# Patient Record
Sex: Female | Born: 1950 | Race: Black or African American | Hispanic: No | Marital: Single
Health system: Southern US, Community
[De-identification: ages and names within clinical notes are randomized; demographics above are authoritative.]

---

## 2001-03-27 ENCOUNTER — Encounter: Payer: Self-pay | Admitting: Emergency Medicine

## 2001-03-27 ENCOUNTER — Emergency Department (HOSPITAL_COMMUNITY): Admission: AC | Admit: 2001-03-27 | Discharge: 2001-03-28 | Payer: Self-pay

## 2004-04-21 ENCOUNTER — Ambulatory Visit: Payer: Self-pay

## 2005-07-26 ENCOUNTER — Ambulatory Visit: Payer: Self-pay

## 2007-10-22 ENCOUNTER — Ambulatory Visit: Payer: Self-pay

## 2008-01-03 ENCOUNTER — Ambulatory Visit: Payer: Self-pay

## 2008-10-27 ENCOUNTER — Ambulatory Visit: Payer: Self-pay

## 2011-07-15 ENCOUNTER — Emergency Department: Payer: Self-pay | Admitting: Emergency Medicine

## 2011-07-15 LAB — COMPREHENSIVE METABOLIC PANEL
Albumin: 3.8 g/dL (ref 3.4–5.0)
Alkaline Phosphatase: 85 U/L (ref 50–136)
Anion Gap: 8 (ref 7–16)
BUN: 18 mg/dL (ref 7–18)
Bilirubin,Total: 0.3 mg/dL (ref 0.2–1.0)
Calcium, Total: 9.2 mg/dL (ref 8.5–10.1)
Chloride: 106 mmol/L (ref 98–107)
Co2: 29 mmol/L (ref 21–32)
Creatinine: 1.12 mg/dL (ref 0.60–1.30)
EGFR (African American): >60
EGFR (Non-African Amer.): 53 — ABNORMAL LOW
Glucose: 88 mg/dL (ref 65–99)
Osmolality: 286 (ref 275–301)
Potassium: 4.6 mmol/L (ref 3.5–5.1)
SGOT(AST): 46 U/L — ABNORMAL HIGH (ref 15–37)
SGPT (ALT): 32 U/L
Sodium: 143 mmol/L (ref 136–145)
Total Protein: 8.7 g/dL — ABNORMAL HIGH (ref 6.4–8.2)

## 2011-07-15 LAB — CBC
HCT: 40.4 % (ref 35.0–47.0)
HGB: 13.3 g/dL (ref 12.0–16.0)
MCH: 31.2 pg (ref 26.0–34.0)
MCHC: 33 g/dL (ref 32.0–36.0)
MCV: 95 fL (ref 80–100)
Platelet: 388 10*3/uL (ref 150–440)
RBC: 4.27 10*6/uL (ref 3.80–5.20)
RDW: 13.4 % (ref 11.5–14.5)
WBC: 6.2 10*3/uL (ref 3.6–11.0)

## 2012-05-27 ENCOUNTER — Emergency Department: Payer: Self-pay | Admitting: Emergency Medicine

## 2012-06-24 ENCOUNTER — Emergency Department: Payer: Self-pay | Admitting: Emergency Medicine

## 2012-07-24 ENCOUNTER — Emergency Department: Payer: Self-pay | Admitting: Emergency Medicine

## 2014-04-03 ENCOUNTER — Emergency Department: Payer: Self-pay | Admitting: Emergency Medicine

## 2014-06-10 ENCOUNTER — Emergency Department: Payer: Self-pay | Admitting: Emergency Medicine

## 2015-11-11 ENCOUNTER — Other Ambulatory Visit: Payer: Self-pay | Admitting: Obstetrics and Gynecology

## 2015-11-11 DIAGNOSIS — Z1231 Encounter for screening mammogram for malignant neoplasm of breast: Secondary | ICD-10-CM

## 2015-11-18 ENCOUNTER — Ambulatory Visit
Admission: RE | Admit: 2015-11-18 | Discharge: 2015-11-18 | Disposition: A | Discharge status: Home / Self Care | Payer: BLUE CROSS/BLUE SHIELD | Source: Ambulatory Visit | Attending: Obstetrics and Gynecology | Admitting: Obstetrics and Gynecology

## 2015-11-18 DIAGNOSIS — Z1231 Encounter for screening mammogram for malignant neoplasm of breast: Secondary | ICD-10-CM | POA: Diagnosis not present

## 2016-07-21 ENCOUNTER — Other Ambulatory Visit: Payer: Self-pay | Admitting: Family Medicine

## 2016-07-21 DIAGNOSIS — Z1231 Encounter for screening mammogram for malignant neoplasm of breast: Secondary | ICD-10-CM

## 2017-01-02 ENCOUNTER — Ambulatory Visit
Admission: RE | Admit: 2017-01-02 | Discharge: 2017-01-02 | Disposition: A | Discharge status: Home / Self Care | Payer: BLUE CROSS/BLUE SHIELD | Source: Ambulatory Visit | Attending: Family Medicine | Admitting: Family Medicine

## 2017-01-02 DIAGNOSIS — Z1231 Encounter for screening mammogram for malignant neoplasm of breast: Secondary | ICD-10-CM | POA: Diagnosis present

## 2017-02-15 ENCOUNTER — Other Ambulatory Visit: Payer: Self-pay | Admitting: Obstetrics and Gynecology

## 2017-02-15 DIAGNOSIS — M8580 Other specified disorders of bone density and structure, unspecified site: Secondary | ICD-10-CM

## 2017-04-16 ENCOUNTER — Ambulatory Visit
Admission: RE | Admit: 2017-04-16 | Discharge: 2017-04-16 | Disposition: A | Discharge status: Home / Self Care | Payer: BLUE CROSS/BLUE SHIELD | Source: Ambulatory Visit | Attending: Obstetrics and Gynecology | Admitting: Obstetrics and Gynecology

## 2017-04-16 DIAGNOSIS — M8588 Other specified disorders of bone density and structure, other site: Secondary | ICD-10-CM | POA: Diagnosis not present

## 2017-04-16 DIAGNOSIS — M8580 Other specified disorders of bone density and structure, unspecified site: Secondary | ICD-10-CM | POA: Diagnosis present

## 2018-07-19 ENCOUNTER — Other Ambulatory Visit: Payer: Self-pay | Admitting: Obstetrics and Gynecology

## 2018-07-19 DIAGNOSIS — Z1231 Encounter for screening mammogram for malignant neoplasm of breast: Secondary | ICD-10-CM

## 2019-08-21 ENCOUNTER — Ambulatory Visit: Payer: Medicare HMO | Attending: Internal Medicine

## 2019-08-21 DIAGNOSIS — Z23 Encounter for immunization: Secondary | ICD-10-CM | POA: Insufficient documentation

## 2019-08-21 NOTE — Progress Notes (Signed)
   Covid-19 Vaccination Clinic  Name:  Rina Adney    MRN: [id number] DOB: [birth year]  08/21/2019  Ms. Eckard was observed post Covid-19 immunization for 15 minutes without incidence. She was provided with Vaccine Information Sheet and instruction to access the V-Safe system.   Ms. Hilger was instructed to call 911 with any severe reactions post vaccine: Marland Kitchen Difficulty breathing  . Swelling of your face and throat  . A fast heartbeat  . A bad rash all over your body  . Dizziness and weakness    Immunizations Administered    Name Date Dose VIS Date Route   Pfizer COVID-19 Vaccine 08/21/2019  1:00 PM 0.3 mL 06/06/2019 Intramuscular   Manufacturer: [employer], [employer]   Lot: [id number]   NDC: [address]-5

## 2019-09-16 ENCOUNTER — Ambulatory Visit: Payer: Medicare Other | Attending: Internal Medicine

## 2019-09-16 DIAGNOSIS — Z23 Encounter for immunization: Secondary | ICD-10-CM

## 2019-09-16 NOTE — Progress Notes (Signed)
   Covid-19 Vaccination Clinic  Name:  Evie Croston    MRN: [id number] DOB: [birth year]  09/16/2019  Ms. Chait was observed post Covid-19 immunization for 15 minutes without incident. She was provided with Vaccine Information Sheet and instruction to access the V-Safe system.   Ms. Mastropietro was instructed to call 911 with any severe reactions post vaccine: Marland Kitchen Difficulty breathing  . Swelling of face and throat  . A fast heartbeat  . A bad rash all over body  . Dizziness and weakness   Immunizations Administered    Name Date Dose VIS Date Route   Pfizer COVID-19 Vaccine 09/16/2019  9:01 AM 0.3 mL 06/06/2019 Intramuscular   Manufacturer: [employer], [employer]   Lot: [id number]   NDC: [address]-6

## 2020-01-26 ENCOUNTER — Other Ambulatory Visit: Payer: Self-pay | Admitting: Obstetrics and Gynecology

## 2020-01-26 DIAGNOSIS — Z1231 Encounter for screening mammogram for malignant neoplasm of breast: Secondary | ICD-10-CM

## 2020-02-24 ENCOUNTER — Ambulatory Visit
Admission: RE | Admit: 2020-02-24 | Discharge: 2020-02-24 | Disposition: A | Discharge status: Home / Self Care | Payer: Medicare Other | Source: Ambulatory Visit | Attending: Obstetrics and Gynecology | Admitting: Obstetrics and Gynecology

## 2020-02-24 ENCOUNTER — Other Ambulatory Visit: Payer: Self-pay

## 2020-02-24 DIAGNOSIS — Z1231 Encounter for screening mammogram for malignant neoplasm of breast: Secondary | ICD-10-CM | POA: Insufficient documentation

## 2021-06-13 ENCOUNTER — Other Ambulatory Visit: Payer: Self-pay | Admitting: Obstetrics and Gynecology

## 2021-06-14 ENCOUNTER — Other Ambulatory Visit: Payer: Self-pay | Admitting: Obstetrics and Gynecology

## 2021-06-16 ENCOUNTER — Other Ambulatory Visit: Payer: Self-pay | Admitting: Obstetrics and Gynecology

## 2021-06-16 DIAGNOSIS — Z1231 Encounter for screening mammogram for malignant neoplasm of breast: Secondary | ICD-10-CM

## 2021-08-06 IMAGING — MG DIGITAL SCREENING BILAT W/ TOMO W/ CAD
8 series · 8 of 24 positions shown · non-contrast
Comparison: Previous exam(s).

CLINICAL DATA: Screening.

EXAM:
DIGITAL SCREENING BILATERAL MAMMOGRAM WITH TOMO AND CAD

[L MLO synth-2D]
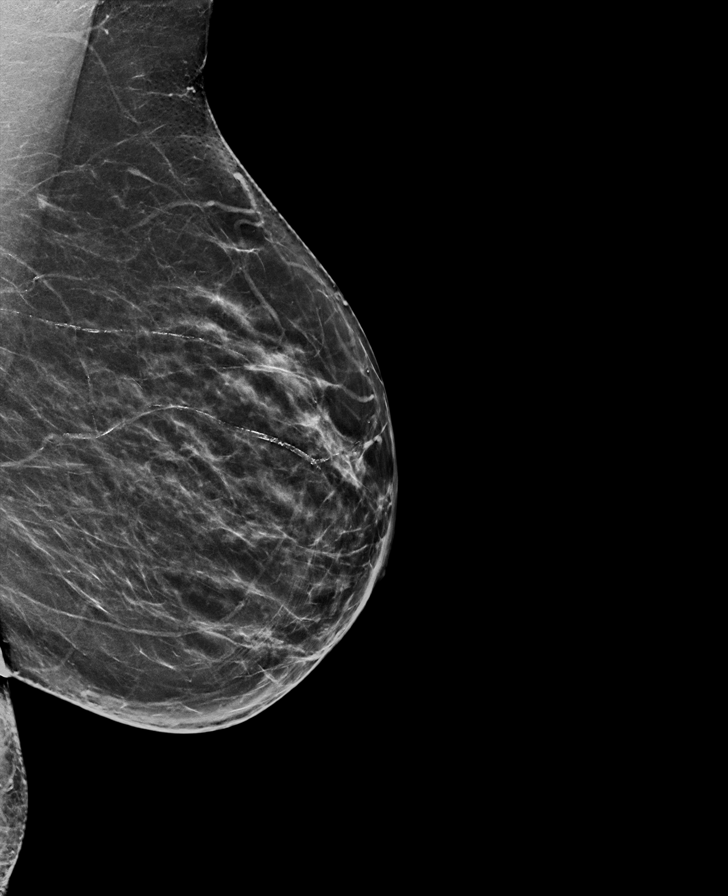

[R CC synth-2D]
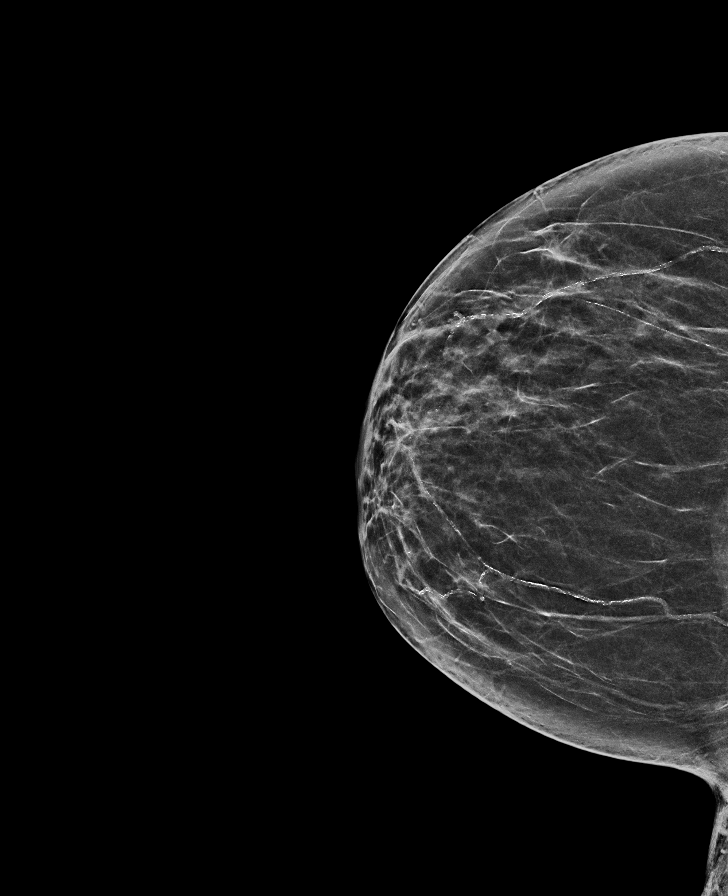

[L CC synth-2D]
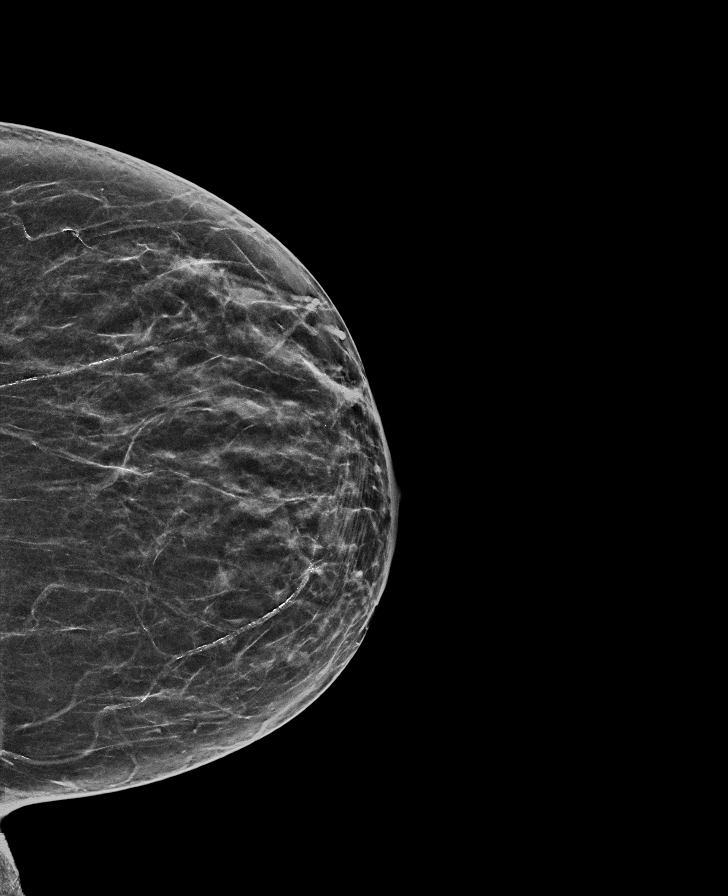

[R MLO synth-2D]
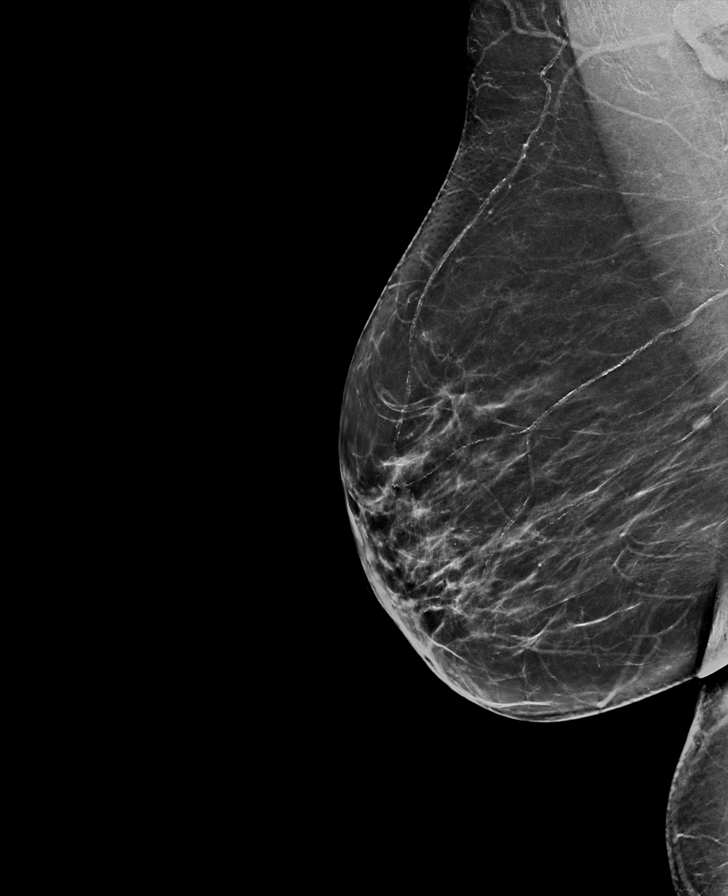

[R MLO tomo · tomo slice 37/73.0]
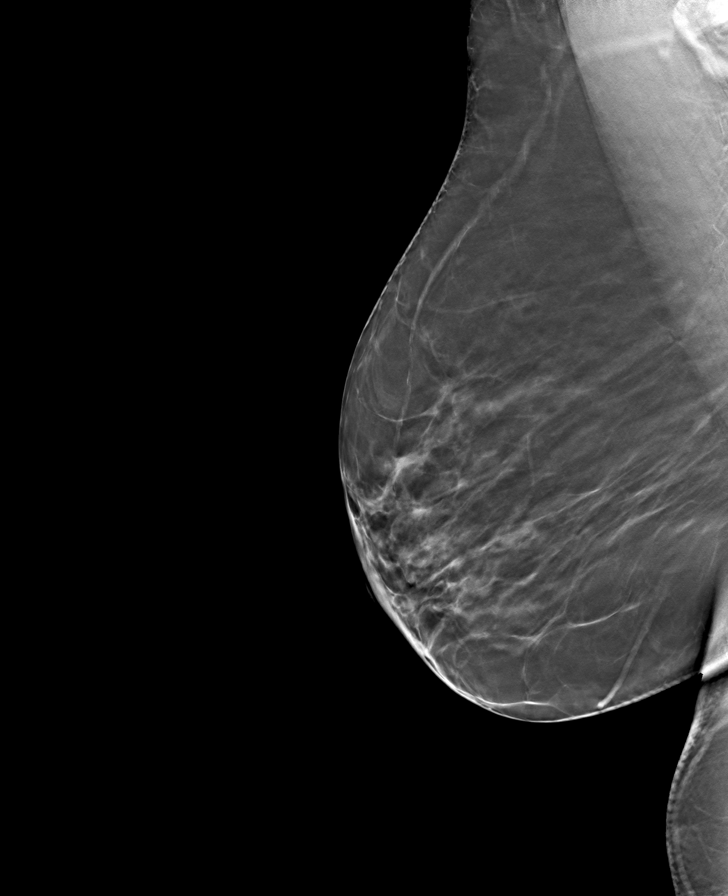

[L MLO tomo · tomo slice 35/70.0]
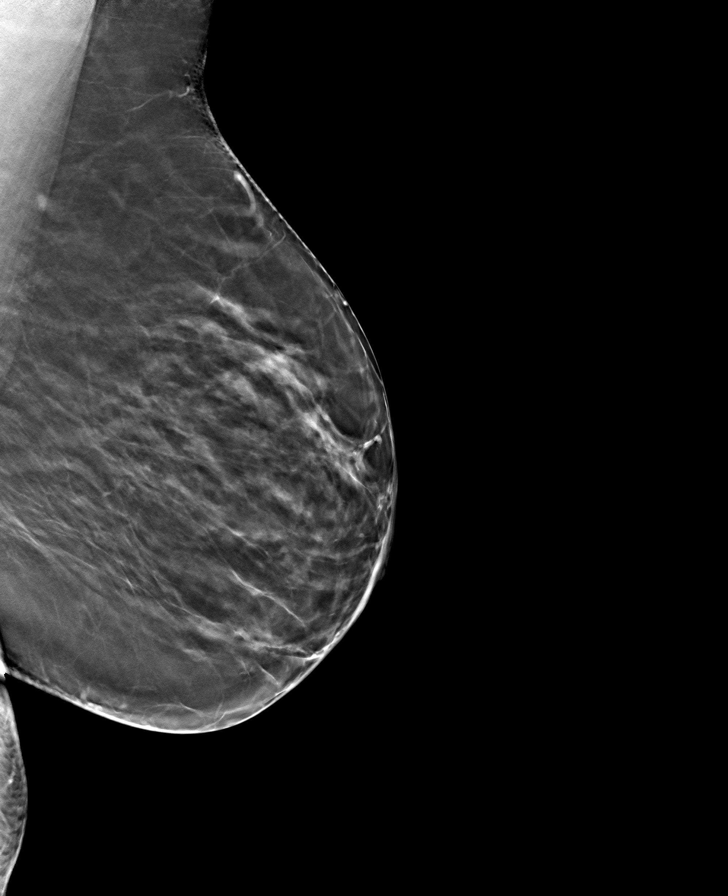

[L CC tomo · tomo slice 31/60.0]
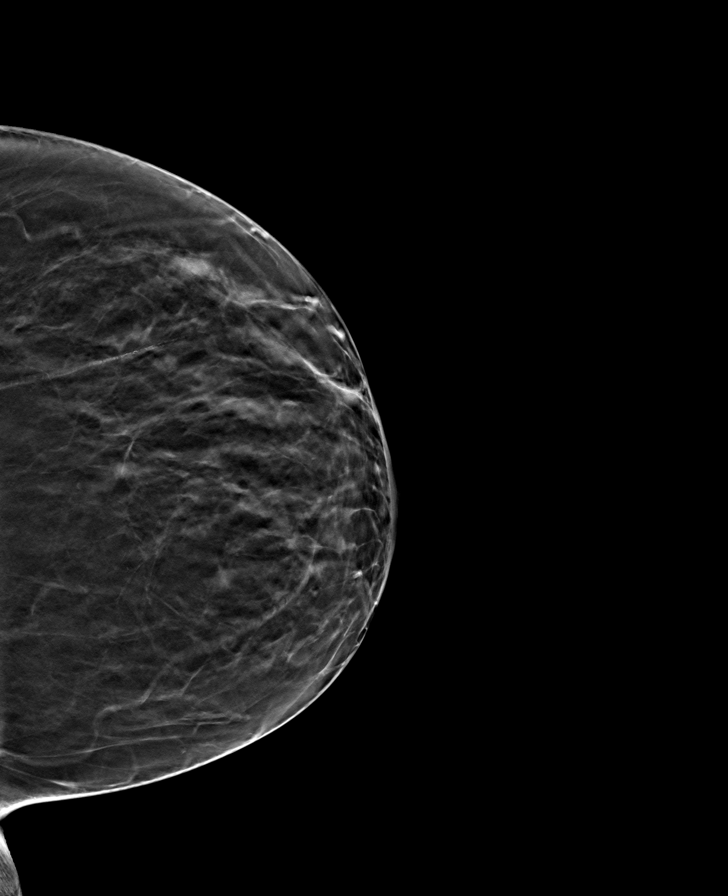

[R CC tomo · tomo slice 31/62.0]
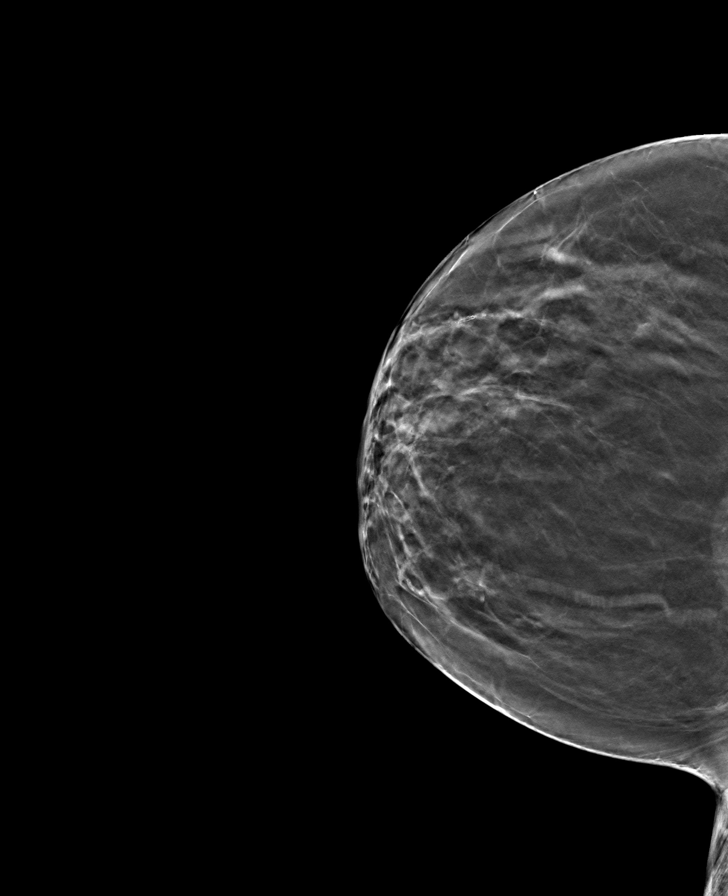

[8 of 24 positions shown; findings below may reference images not displayed]

ACR Breast Density Category b: There are scattered areas of
fibroglandular density.
FINDINGS: There are no findings suspicious for malignancy. Images were
processed with CAD.
IMPRESSION: No mammographic evidence of malignancy. A result letter of this
screening mammogram will be mailed directly to the patient.

RECOMMENDATION:
Screening mammogram in one year. (Code:[id number])

BI-RADS CATEGORY  1: Negative.

## 2021-09-09 ENCOUNTER — Other Ambulatory Visit: Payer: Self-pay | Admitting: Obstetrics and Gynecology

## 2021-09-09 DIAGNOSIS — Z1231 Encounter for screening mammogram for malignant neoplasm of breast: Secondary | ICD-10-CM

## 2021-10-20 ENCOUNTER — Ambulatory Visit
Admission: RE | Admit: 2021-10-20 | Discharge: 2021-10-20 | Disposition: A | Discharge status: Home / Self Care | Payer: Medicare HMO | Source: Ambulatory Visit | Attending: Obstetrics and Gynecology | Admitting: Obstetrics and Gynecology

## 2021-10-20 DIAGNOSIS — Z1231 Encounter for screening mammogram for malignant neoplasm of breast: Secondary | ICD-10-CM | POA: Insufficient documentation

## 2022-03-17 ENCOUNTER — Other Ambulatory Visit: Payer: Self-pay | Admitting: Obstetrics and Gynecology

## 2022-03-17 DIAGNOSIS — Z780 Asymptomatic menopausal state: Secondary | ICD-10-CM

## 2023-07-12 DIAGNOSIS — M329 Systemic lupus erythematosus, unspecified: Secondary | ICD-10-CM | POA: Diagnosis present

## 2023-11-22 ENCOUNTER — Other Ambulatory Visit: Payer: Self-pay | Admitting: Obstetrics and Gynecology

## 2023-11-22 DIAGNOSIS — Z780 Asymptomatic menopausal state: Secondary | ICD-10-CM

## 2023-12-14 ENCOUNTER — Other Ambulatory Visit: Payer: Self-pay | Admitting: Obstetrics and Gynecology

## 2023-12-14 DIAGNOSIS — Z1231 Encounter for screening mammogram for malignant neoplasm of breast: Secondary | ICD-10-CM

## 2023-12-17 DIAGNOSIS — E1129 Type 2 diabetes mellitus with other diabetic kidney complication: Secondary | ICD-10-CM | POA: Insufficient documentation

## 2024-01-24 ENCOUNTER — Ambulatory Visit
Admission: RE | Admit: 2024-01-24 | Discharge: 2024-01-24 | Disposition: A | Discharge status: Home / Self Care | Source: Ambulatory Visit | Attending: Obstetrics and Gynecology | Admitting: Obstetrics and Gynecology

## 2024-01-24 DIAGNOSIS — Z780 Asymptomatic menopausal state: Secondary | ICD-10-CM

## 2024-01-24 DIAGNOSIS — Z1231 Encounter for screening mammogram for malignant neoplasm of breast: Secondary | ICD-10-CM | POA: Insufficient documentation

## 2024-03-13 ENCOUNTER — Other Ambulatory Visit
Admission: RE | Admit: 2024-03-13 | Discharge: 2024-03-13 | Disposition: A | Discharge status: Home / Self Care | Source: Ambulatory Visit | Attending: Nephrology | Admitting: Nephrology

## 2024-03-13 DIAGNOSIS — N186 End stage renal disease: Secondary | ICD-10-CM | POA: Insufficient documentation

## 2024-03-13 DIAGNOSIS — D631 Anemia in chronic kidney disease: Secondary | ICD-10-CM | POA: Insufficient documentation

## 2024-03-13 LAB — RENAL FUNCTION PANEL
Albumin: 2.9 g/dL — ABNORMAL LOW (ref 3.5–5.0)
Anion gap: 18 — ABNORMAL HIGH (ref 5–15)
BUN: 21 mg/dL (ref 8–23)
CO2: 26 mmol/L (ref 22–32)
Calcium: 7.6 mg/dL — ABNORMAL LOW (ref 8.9–10.3)
Chloride: 92 mmol/L — ABNORMAL LOW (ref 98–111)
Creatinine, Ser: 10.66 mg/dL — ABNORMAL HIGH (ref 0.44–1.00)
GFR, Estimated: 4 mL/min — ABNORMAL LOW (ref >60)
Glucose, Bld: 94 mg/dL (ref 70–99)
Phosphorus: 3.8 mg/dL (ref 2.5–4.6)
Potassium: 2.8 mmol/L — ABNORMAL LOW (ref 3.5–5.1)
Sodium: 136 mmol/L (ref 135–145)

## 2024-03-13 LAB — HEMOGLOBIN: Hemoglobin: 9.1 g/dL — ABNORMAL LOW (ref 12.0–15.0)

## 2024-03-13 LAB — MAGNESIUM: Magnesium: 1.4 mg/dL — ABNORMAL LOW (ref 1.7–2.4)

## 2024-04-21 NOTE — Discharge Summary (Signed)
 ------------------------------------------------------------------------------- Attestation signed by Bethel Norleen Blase, MD at 04/21/24 1244 Physician Discharge Summary  Identifying Information:  Name:  Cynthia Harvey DOB:   Feb 02, 1951 [facility] MR:  [id number] PCP:   [facility] [place]  Day of Discharge Hospital Day # 15  Principal Problem:   Peritonitis associated with peritoneal dialysis Active Problems:   CKD (chronic kidney disease) stage 5, GFR less than 15 ml/min    (CMS-HCC)   ESRD on dialysis (CMS-HCC)   Abdominal pain   Appointments which have been scheduled for you    Jul 10, 2024 10:00 AM (Arrive by 9:45 AM) RETURN RHEUMATOLOGY with Nat Earnie Baumgartner, DO [facility] RHEUMATOLOGY SPECIALTY [place] [facility] REGION) [address] [facility] 1 through [address] [place] [address] [phone]         Pending Labs     Order Current Status   AFB culture Preliminary result      Length of Discharge:  I spent less than 30 mins in the discharge of this patient.  I saw and evaluated Cynthia Harvey on the date of service with the resident team, discussed the case with the resident physician, and agree with the documentation as recorded in the attached note.  Norleen Bethel, MD Division of General Internal Medicine & Clinical Epidemiology      ======================================================================================================================================  -------------------------------------------------------------------------------   Physician Discharge Summary [facility] GENERAL MEDICINE UNIT [facility] [address] [place] [address] Dept: [phone] Loc: [phone]   Identifying Information:  Cynthia Harvey 14-Apr-1951 [id number]  Primary Care Physician: [facility] [place], [place]   Code Status: Full Code  Admit Date: 04/05/2024  Discharge Date: 04/21/2024   Discharge To: Skilled  nursing facility  Discharge Service: [facility] - General Medicine Floor Team (MED LELON GLENWOOD Balls)   Discharge Attending Physician: Norleen Blase Bethel, MD  Discharge Diagnoses:  Principal Problem:   Peritonitis associated with peritoneal dialysis (POA: Yes) Active Problems:   CKD (chronic kidney disease) stage 5, GFR 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 [facility] Course:  Outpatient To-Do's: [ ]  Patient with multiple loose bowel movements per day after resolution of her peritonitis. Also with some nausea and intermittent episodes of emesis. Unclear etiology but patient declined treatment while inpatient. Recommend outpatient work up for SIBO or malabsorption syndromes if persistent.  [ ]  Nonemergent outpatient MRI abdomen with and without contrast for further evaluation of right upper pole renal lesion.  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 Sent to ED by outpatient nephrologist due to worsening abdominal pain, tachycardia, and fever. On admission was started on broad spectrum antibiotics with Ceftazidime and Vancomycin. 10/12 Dialysis fluid culture subsequently grew Pseudomonas resistant to Ceftazidime and Pip-Tazo. Patient underwent PD cath removal with transplant surgery service 10/14 and continued treatment with Meropenem (10/14-10/15), with administration of PO Nystatin 50,000u TID for fungal prophylaxis during first week of abx (10/13-10/19). Her PD catheter subsequently grew PsA as well. Following PD cath removal, patient was notably very somnolent and with bradypnea, for which a rapid response was called. Ultimately, it was deemed to be secondary to impaired clearance of  narcotics received during the procedure iso of her ESRD, and patient's  mentation and RR improved within the subsequent hours. Given her blood cultures remained without growth, she underwent placement of a tunneled HD cath with VIR 10/15 and underwent her first bout of HD 10/16 for initiation of her TTS schedule. Her Meropenem was subsequently narrowed to Ciprofloxacin 500 mg daily for completion of a 3 week course (10/16-11/3). She continued to tolerate iHD well and was discharged with a chair secured at [facility] for TTS schedule.   Hypotension  Hypoglycemia  Patient had recent admission requiring MICU stay for hypotension of unknown etiology, discharged 10/02. Infectious eval, TTE, and cortisol stim were unremarkable. Her BP improved on fludrocortisone and midodrine, which she has remained on since last hospitalization. This admission, she was continued on her home fludrocortisone and midodrine but continued to require administration of D5LR maintenance fluids for hypotension as well as hypoglycemia. Her cortisol stimulation test was repeated and was unremarkable, although timing of lab collection was not performed correctly so overall low clinical utility. Therefore, cortisol stimulation test repeated again given ongoing hypoglycemia, which again revealed normal cortisol values. Ultimately, discharged on home fludrocortisone and midodrine.   Volume Overload Patient noted increased bilateral lower extremity swelling after not undergoing dialysis for >72 hours. CT A/P notable for increased bilateral pleural effusions and Pro-BNP checked to be elevated to 13k from no prior baseline. TTE 10/13 non-concerning with only grade I diastolic dysfunction. Excess volume likely related to be unable to undergo dialysis since 10/12-10/15 and being repeatedly administered IVF for low blood pressures. Additionally, Pro-BNP renally cleared so also unsurprising that it was elevated in this setting. Ultimately improved with  initiation of iHD.  Abdominal Pain  Constipation  Diarrhea  Nausea Patient with severe crampy abdominal pain on presentation. Pain most likely related to peritonitis, as CT A/P with reactive inflammatory changes and no evidence of obstruction. Patient had her bowel regimen gradually increased and passed several large bowel movements on 10/17 with subsequent improvement in her abdominal pain. She continued to have ongoing loose bowel movements, for which he bowel regimen was held. With reduction in her bowel regimen, her diarrhea improved but remained persistent. C. Diff testing was obtained that was negative. Offered patient pharmaceutical agents for management of diarrhea but she declined. Patient also intermittently complained of nausea for which she was provided PRN Zofran as her Qtc permitted. KUB with no evidence of bowel obstruction. Recommend outpatient work up with PCP if persistent.     Atrial fibrillation Prolonged QTc, improved Patient noted to have new onset atrial fibrillation on admission. Given unremarkable TTE, likely iso of acute illness and possibly exacerbated by volume overload from missed dialysis sessions. Patient also noted to have fluctuating Qtc intervals throughout admission. EKG 10/16 with Qtc 429 ms prior starting Ciprofloxacin, and subsequently 10/17 afterwards. Considered safe to continue for SBP treatment.    Chronic Problems SLE Continued home Plaquenil 200mg  daily   Anemia of Chronic Disease Continued EPO TTS with dialysis   Osteoarthritis of Bilateral Knees Continued home gabapentin 300mg  BID and provided tylenol PRN   Renal lesion Noted to have intermediate attenuation exophytic right upper pole renal lesion measuring up to 2.2 cm seen on CT A/P, stable from prior imaging.  The patient's hospital stay has been complicated by the following clinically significant conditions requiring additional evaluation and treatment or having a significant effect of  this patient's care: - Anemia POA requiring further investigation or monitoring - Chronic kidney disease POA requiring further investigation, treatment, or monitoring - Acidosis POA requiring further  investigation, treatment, or monitoring    Outpatient Provider Follow Up Issues:  [ ]  Patient with multiple loose bowel movements per day after resolution of her peritonitis. Also with some nausea and intermittent episodes of emesis. Unclear etiology but patient declined treatment while inpatient. Recommend outpatient work up for SIBO or malabsorption syndromes if persistent.   [ ]  Nonemergent outpatient MRI abdomen with and without contrast for further evaluation of right upper pole renal lesion.  Touchbase with Outpatient Provider: Warm Handoff: Completed on 04/21/24 by Quintin LITTIE Bristol, MD  (Intern) via [facility]  Procedures: internal jugular line, paracentesis, and dialysis ______________________________________________________________________ Discharge Medications:    Your Medication List     PAUSE taking these medications    potassium chloride 20 MEQ ER tablet Wait to take this until your doctor or other care provider tells you to start again. Take 2 tablets (40 mEq total) by mouth daily.       STOP taking these medications    calcitriol 0.5 MCG capsule Commonly known as: ROCALTROL   dilTIAZem 300 MG 24 hr capsule Commonly known as: CARDIZEM CD   fluconazole 100 MG tablet Commonly known as: DIFLUCAN   prochlorperazine 5 MG tablet Commonly known as: COMPAZINE   torsemide 100 MG tablet Commonly known as: DEMADEX       START taking these medications    acetaminophen 325 MG tablet Commonly known as: TYLENOL Take 2 tablets (650 mg total) by mouth every four (4) hours as needed.   ciprofloxacin HCl 500 MG tablet Commonly known as: CIPRO Take 1 tablet (500 mg total) by mouth daily for 7 days.   epoetin alfa-EPBX 10,000 unit/mL Soln injection Commonly  known as: RETACRIT Inject 1 mL (10,000 Units total) under the skin 3 (three) times a week. Start taking on: April 23, 2024   lidocaine 4 % patch Commonly known as: ASPERCREME Place 1 patch on the skin daily. Start taking on: April 22, 2024   ondansetron 4 MG disintegrating tablet Commonly known as: ZOFRAN-ODT Take 1 tablet (4 mg total) by mouth every eight (8) hours as needed for up to 7 days.       CONTINUE taking these medications    atorvastatin 20 MG tablet Commonly known as: LIPITOR Take 1 tablet (20 mg total) by mouth daily.   cinacalcet 60 MG tablet Commonly known as: SENSIPAR Take 1 tablet (60 mg total) by mouth daily.   fludrocortisone 0.1 mg tablet Commonly known as: FLORINEF Take 1 tablet (0.1 mg total) by mouth daily.   fluticasone furoate-vilanterol 100-25 mcg/dose inhaler Commonly known as: BREO ELLIPTA Inhale 1 puff daily. (2 blisters=1puff)   gabapentin 300 MG capsule Commonly known as: NEURONTIN Take 1 capsule (300 mg total) by mouth two (2) times a day.   hydroxychloroquine 200 mg tablet Commonly known as: PLAQUENIL Take 1 tablet (200 mg total) by mouth daily.   midodrine 10 MG tablet Commonly known as: PROAMATINE Take 2 tablets (20 mg total) by mouth three (3) times a day.        Allergies: Hydrochlorothiazide, Lisinopril, Spironolactone, and Empagliflozin ______________________________________________________________________ Pending Test Results: Pending Labs     Order Current Status   AFB culture Preliminary result       Most Recent Labs: All lab results last 24 hours -  Recent Results (from the past 24 hours)  POCT Glucose   Collection Time: 04/20/24  6:32 PM  Result Value Ref Range   Glucose, POC 82 70 - 179 mg/dL  POCT Glucose  Collection Time: 04/20/24 11:33 PM  Result Value Ref Range   Glucose, POC 74 70 - 179 mg/dL  CBC   Collection Time: 04/21/24  3:37 AM  Result Value Ref Range   WBC 8.1 3.6 - 11.2 10*9/L    RBC 2.51 (L) 3.95 - 5.13 10*12/L   HGB 7.9 (L) 11.3 - 14.9 g/dL   HCT 75.1 (L) 65.9 - 55.9 %   MCV 98.9 (H) 77.6 - 95.7 fL   MCH 31.6 25.9 - 32.4 pg   MCHC 32.0 32.0 - 36.0 g/dL   RDW 82.1 (H) 87.7 - 84.7 %   MPV 8.1 6.8 - 10.7 fL   Platelet 249 150 - 450 10*9/L  Basic Metabolic Panel   Collection Time: 04/21/24  3:37 AM  Result Value Ref Range   Sodium 138 135 - 145 mmol/L   Potassium 3.4 3.4 - 4.8 mmol/L   Chloride 99 98 - 107 mmol/L   CO2 29.0 20.0 - 31.0 mmol/L   Anion Gap 10 5 - 14 mmol/L   BUN 6 (L) 9 - 23 mg/dL   Creatinine 4.67 (H) 9.44 - 1.02 mg/dL   BUN/Creatinine Ratio 1    eGFR CKD-EPI (2021) Female 8 (L) >=60 mL/min/1.56m2   Glucose 65 (L) 70 - 179 mg/dL   Calcium 9.1 8.7 - 89.5 mg/dL  POCT Glucose   Collection Time: 04/21/24  6:01 AM  Result Value Ref Range   Glucose, POC 67 (L) 70 - 179 mg/dL  ECG 12 Lead   Collection Time: 04/21/24  8:02 AM  Result Value Ref Range   EKG Systolic BP  mmHg   EKG Diastolic BP  mmHg   EKG Ventricular Rate 80 BPM   EKG Atrial Rate 80 BPM   EKG P-R Interval 150 ms   EKG QRS Duration 76 ms   EKG Q-T Interval 422 ms   EKG QTC Calculation 486 ms   EKG Calculated P Axis 53 degrees   EKG Calculated R Axis 11 degrees   EKG Calculated T Axis 24 degrees   QTC Fredericia 464 ms  POCT Glucose   Collection Time: 04/21/24  8:45 AM  Result Value Ref Range   Glucose, POC 73 70 - 179 mg/dL    Relevant Studies/Radiology: XR Abdomen 1 View Result Date: 04/21/2024 EXAM: XR ABDOMEN 1 VIEW ACCESSION: [id number] UN REPORT DATE: 04/21/2024 8:55 AM CLINICAL INDICATION: 73 years old with NAUSEA & VOMITING  COMPARISON: Radiograph 04/07/2024. TECHNIQUE: Supine view of the abdomen, 2 image(s) FINDINGS: Partially visualized hemodialysis catheter tip projecting over the SVC. Peritoneal dialysis catheter has been removed. Nonobstructive bowel gas pattern. Lung bases are unremarkable. No acute osseous abnormality.   No evidence of bowel obstruction.    ECG 12 Lead Result Date: 04/21/2024 SINUS RHYTHM WITH PREMATURE ATRIAL BEATS SEPTAL INFARCT  , AGE UNDETERMINED QTcB >= 480 msec ABNORMAL ECG WHEN COMPARED WITH ECG OF 19-Apr-2024 12:31, SEPTAL INFARCT  IS NOW PRESENT  ECG 12 Lead Result Date: 04/20/2024 NORMAL SINUS RHYTHM POSSIBLE ANTEROSEPTAL INFARCT NONSPECIFIC ST AND T WAVE ABNORMALITY ABNORMAL ECG WHEN COMPARED WITH ECG OF 07-Apr-2024 09:59, PREMATURE ATRIAL BEATS ARE NO LONGER PRESENT QUESTIONABLE CHANGE IN INITIAL FORCES OF SEPTAL LEADS , CONSIDER CHEST LEAD REVERSAL Confirmed by Lennie Cough [phone]) on 04/20/2024 9:43:32 AM  ECG 12 Lead Result Date: 04/19/2024 SINUS RHYTHM WITH PREMATURE ATRIAL BEATS NONSPECIFIC T WAVE ABNORMALITY WHEN COMPARED WITH ECG OF 18-Apr-2024 07:54, NO SIGNIFICANT CHANGE WAS FOUND Confirmed by Vinie Dunnings ([id number]) on 04/19/2024 6:49:19 PM  ECG  12 Lead Result Date: 04/18/2024 SINUS RHYTHM WITH PREMATURE ATRIAL BEATS NONSPECIFIC T WAVE ABNORMALITY ABNORMAL ECG WHEN COMPARED WITH ECG OF 15-Apr-2024 08:31, NO SIGNIFICANT CHANGE WAS FOUND Confirmed by Von Shawl [phone]) on 04/18/2024 5:30:59 PM  ECG 12 Lead Result Date: 04/16/2024 SINUS RHYTHM WITH PREMATURE ATRIAL BEATS SEPTAL INFARCT  (CITED ON OR BEFORE 05-Apr-2024) QTcB >= 480 msec ABNORMAL ECG WHEN COMPARED WITH ECG OF 11-Apr-2024 08:35, PREMATURE ATRIAL BEATS ARE NOW PRESENT SERIAL CHANGES OF SEPTAL INFARCT  PRESENT Confirmed by Leni Mings ([id number]) on 04/16/2024 9:51:58 PM  ECG 12 Lead Result Date: 04/15/2024 SINUS RHYTHM WITH PREMATURE ATRIAL BEATS LOW VOLTAGE QRS SEPTAL INFARCT  (CITED ON OR BEFORE 05-Apr-2024) ABNORMAL ECG WHEN COMPARED WITH ECG OF 14-Apr-2024 13:16, NO SIGNIFICANT CHANGE WAS FOUND Confirmed by Claudene Legions ([id number]) on 04/15/2024 7:41:49 PM  ECG 12 Lead Result Date: 04/15/2024 SINUS RHYTHM WITH PREMATURE ATRIAL BEATS SEPTAL INFARCT  (CITED ON OR BEFORE 05-Apr-2024) ABNORMAL ECG WHEN COMPARED WITH ECG OF 14-Apr-2024 13:15, NO  SIGNIFICANT CHANGE WAS FOUND Confirmed by Claudene Legions ([id number]) on 04/15/2024 5:08:35 PM  IR Insert Tunneled Catheter (Age Greater Than 5 Years) Result Date: 04/14/2024 PROCEDURE: Tunneled central venous catheter placement EXAM: IR INSERT TUNNELED CATHETER AGE GREATER THAN 5 YRS ACCESSION: [id number] UN REPORT DATE: 04/14/2024 10:00 AM Procedural Personnel Attending physician(s): Rodger Resident physician(s): None Advanced practice provider(s): None Pre-procedure diagnosis: ESRD Post-procedure diagnosis: Same Indication: Dialysis Additional clinical history: None _______________________________________________________________ PROCEDURE SUMMARY: - Venous access with ultrasound guidance - Tunneled central venous catheter insertion with fluoroscopic guidance - Additional procedure(s): None PROCEDURE DETAILS: Pre-procedure Consent: Informed consent for the procedure including risks, benefits and alternatives was obtained and time-out was performed prior to the procedure. Preparation: The site was prepared and draped using maximal sterile barrier technique including cutaneous antisepsis. Anesthesia/sedation Level of anesthesia/sedation: Moderate sedation (conscious sedation) Anesthesia/sedation administered by: Independent trained observer under attending supervision with continuous monitoring of the patient's level of consciousness and physiologic status Total intra-service sedation time (minutes): I personally spent 20 minutes, continuously monitoring the patient face-to-face during the administration of moderate sedation. Radiology nurse was present for the duration of the procedure to assist in patient monitoring. Pre and Post Sedation activities have been reviewed. Access Local anesthesia was administered. The vessel was evaluated with ultrasonography and determined to be patent. Real time ultrasound was used to visualize needle entry into the vessel and a permanent image was stored. Laterality: Right Vein  accessed: Internal jugular vein Access technique: Micropuncture set with 21 gauge needle Venography Indication for venography: Not applicable Vein catheterized: Not applicable Findings: Not applicable Catheter placement An incision was made near the venous access site and the catheter was tunneled subcutaneously to the venous access site. The catheter was advanced via a peel-away sheath into the vein under fluoroscopic guidance. Catheter tip location was fluoroscopically verified and a permanent image was stored. Catheter placed: Hemodialysis Lot number: na Catheter size Braulio): 14.5 Lumens: 2 Power injectable: No Catheter tip: cavoatrial junction Catheter flush: Heparin (1000 units/mL) Closure A sterile dressing was applied. Access site closure technique: Tissue adhesive Catheter securement technique: Non-absorbable suture Contrast Contrast agent: Omnipaque 300 Contrast volume (mL): 0 Radiation Dose Fluoroscopy time (minutes): 1.6 Reference Air Kerma (mGy): 27 Additional Details Additional description of procedure: None Registry event: V/3/g Device used: None Equipment details: None Unique Device Identifiers: Not available Specimens removed: None Estimated blood loss (mL): Less than 10 Standardized report: [email or web address]_v3.1 Attestation Signer name: Alm Rodger I attest that I was present for the entire  procedure. I reviewed the stored images and agree with the report as written. _______________________________________________________________ Complications: No immediate complications.   Insertion of right-sided tunneled double lumen palindrome dialysis central venous catheter in the internal jugular vein, with tip in the expected location of the cavoatrial junction. Plan: The catheter may be used immediately.   ECG 12 Lead Result Date: 04/13/2024 SINUS TACHYCARDIA SEPTAL INFARCT  (CITED ON OR BEFORE 05-Apr-2024) ABNORMAL ECG WHEN COMPARED WITH ECG OF 10-Apr-2024 10:56, QUESTIONABLE CHANGE IN INITIAL  FORCES OF ANTERIOR LEADS Confirmed by Von Shawl [phone]) on 04/13/2024 9:16:56 AM  CT Abdomen Pelvis Wo Contrast Result Date: 04/08/2024 EXAM: CT ABDOMEN PELVIS WO CONTRAST ACCESSION: [id number] UN REPORT DATE: 04/08/2024 8:15 AM CLINICAL INDICATION: 73 years old with Pt with PD associated peritonitis, with worsening abdominal pain, eval for other srs of infection, non - con given renal fn  COMPARISON: CT abdomen pelvis 03/17/2024 TECHNIQUE: A spiral CT scan was obtained without IV contrast from the lung bases to the pubic symphysis.  Images were reconstructed in the axial plane. Coronal and sagittal reformatted images were also provided for further evaluation. Evaluation of the solid organs and vasculature is limited in the absence of intravenous contrast. FINDINGS: LOWER CHEST: Bilateral pleural effusions associated passive atelectasis. LAD calcifications. LIVER: Normal liver contour. No focal liver lesion on non-contrast examination. BILIARY: Question layering sludge within the gallbladder (2:34). The gallbladder is otherwise unremarkable. No intrahepatic biliary ductal dilatation. SPLEEN: Normal in size and contour. Scattered calcified granulomata. PANCREAS: Normal pancreatic contour without signs of inflammation or gross ductal dilatation. ADRENAL GLANDS: Redemonstrated calcifications of the bilateral adrenal glands, possibly sequelae of prior granulomatous infection. KIDNEYS/URETERS: No nephrolithiasis.  No ureteral dilatation or collecting system distention. Unchanged 2.2 cm exophytic intermediate attenuating right interpolar lesion. Similar appearance of subcentimeter bilateral hypoattenuating renal cysts. BLADDER: Underdistended, limiting evaluation. REPRODUCTIVE ORGANS: Unremarkable. GI TRACT: Small hiatal hernia. Stomach is mildly distended with fluid and debris. Duodenum is unremarkable. There are multiple loops of distal small bowel with wall thickening/adjacent edema (3:34). A loop of small  bowel extrudes through a ventral abdominal defect without evidence of obstruction. Similar appearance of submucosal fat deposition in the terminal ileum/ascending colon. Colonic diverticulosis. The appendix is not clearly identified but there are no secondary signs of appendicitis. PERITONEUM/RETROPERITONEUM AND MESENTERY: No free air. Peritoneal dialysis catheter within the lower abdomen/pelvis. There is similar appearance of diffuse abdominopelvic ascites. Of note, since 03/17/2024 there is new nodularity/thickening of the anterior peritoneum/omentum adjacent to the transverse colon (2:55). VASCULATURE: Normal caliber aorta with mild calcified atherosclerosis. Otherwise, limited evaluation without contrast. LYMPH NODES: No adenopathy. BONES and SOFT TISSUES: No aggressive osseous lesions. Multilevel degenerative change of the spine with grade 1 anterolisthesis of L4-L5. Bilateral SI joint subchondral cystic changes. Unchanged appearance of numerous soft tissue nodules, measuring up to 2.7 cm in the left lower quadrant. There is diffuse soft tissue anasarca.   -Overall similar degree of moderate volume abdominopelvic ascites when compared with 03/17/2024. Of note, there is new peritoneal nodularity/thickening suggestive of reactive inflammatory changes. Please note, assessment is limited due to lack of intravenous contrast. - Small bowel wall thickening and adjacent edema is favored to reflect reactive changes in the setting of peritonitis. No evidence of bowel obstruction. - Bilateral pleural effusions and passive atelectasis, increased compared to 03/17/2024.   Echocardiogram W Colorflow Spectral Doppler Result Date: 04/07/2024 Patient Info Name:     Cynthia Harvey Age:     28 years DOB:     February 08, 1951 Gender:  Female MRN:     [id number] Accession #:     [id number] UN Account #:     [id number] Ht:     145 cm Wt:     94 kg BSA:     2.01 m2 BP:     127 /     49 mmHg Exam Date:     04/07/2024 4:12 PM Admit  Date:     04/05/2024 Exam Type:     ECHOCARDIOGRAM W COLORFLOW SPECTRAL DOPPLER Technical Quality:     Fair Staff Sonographer:     Lawayne Pa RDCS Reading Fellow:     Ezra JINNY Horner MD Ordering Physician:     Porter Carrel Study Info Indications      - New onset a fib Procedure(s)   Complete two-dimensional, color flow and Doppler transthoracic echocardiogram is performed. Summary   1. The left ventricle is normal in size with normal wall thickness.   2. The left ventricular systolic function is normal, LVEF is visually estimated at > 55%.   3. The right ventricle is normal in size, with normal systolic function.   4. There is mild to moderate tricuspid regurgitation. Left Ventricle   The left ventricle is normal in size with normal wall thickness. The left ventricular systolic function is normal, LVEF is visually estimated at > 55%. There is grade I diastolic dysfunction (impaired relaxation). Right Ventricle   The right ventricle is normal in size, with normal systolic function. Left Atrium   The left atrium is normal in size. Right Atrium   The right atrium is normal in size. Atrial Septum   Interatrial septum appears aneurysmal. Aortic Valve   The aortic valve is trileaflet with normal appearing leaflets with normal excursion. There is no significant aortic regurgitation. There is no evidence of a significant transvalvular gradient. Mitral Valve   The mitral valve leaflets are normal with normal leaflet mobility. Mitral annular calcification is present (mild). There is no significant mitral valve regurgitation. Tricuspid Valve   The tricuspid valve leaflets are normal, with normal leaflet mobility. There is mild to moderate tricuspid regurgitation. There is no pulmonary hypertension. TR maximum velocity: 2.7 m/s  Estimated PASP: 32 mmHg. Pulmonic Valve   The pulmonic valve is poorly visualized, but probably normal. There is no significant pulmonic regurgitation. There is no evidence of a significant  transvalvular gradient. Aorta   The aorta is normal in size in the visualized segments. Inferior Vena Cava   IVC size and inspiratory change suggest normal right atrial pressure. (0-5 mmHg). Pericardium/Pleural   There is no pericardial effusion. Other Findings   Rhythm: Sinus Rhythm. Ventricles ---------------------------------------------------------------------- Name                                 Value        Normal ---------------------------------------------------------------------- LV Dimensions 2D/MM ----------------------------------------------------------------------  IVS Diastolic Thickness (2D)                                0.7 cm       0.6-0.9 LVID Diastole (2D)                  3.8 cm       3.8-5.2  LVPW Diastolic Thickness (2D)  0.7 cm       0.6-0.9 LVID Systole (2D)                   2.3 cm       2.2-3.5 LV Mass Index (2D Cubed)           36 g/m2         43-95  Relative Wall Thickness (2D)                                  0.37        <=0.42 RV Dimensions 2D/MM ---------------------------------------------------------------------- TAPSE                               2.1 cm         >=1.7 Atria ---------------------------------------------------------------------- Name                                 Value        Normal ---------------------------------------------------------------------- LA Dimensions ---------------------------------------------------------------------- LA Dimension (2D)                   3.6 cm       2.7-3.8 LA Volume Index (4C A-L)        22.06 ml/m2               LA Volume Index (2C A-L)        18.48 ml/m2               LA Volume (BP MOD)                   40 ml               LA Volume Index (BP MOD)        19.70 ml/m2   16.00-34.00 Aortic Valve ---------------------------------------------------------------------- Name                                 Value        Normal ---------------------------------------------------------------------- AV  Doppler ---------------------------------------------------------------------- AV Peak Velocity                   1.7 m/s               AV Peak Gradient                   11 mmHg Mitral Valve ---------------------------------------------------------------------- Name                                 Value        Normal ---------------------------------------------------------------------- MV Diastolic Function ---------------------------------------------------------------------- MV E Peak Velocity                 87 cm/s               MV A Peak Velocity                 99 cm/s               MV E/A  0.9               MV Annular TDI ---------------------------------------------------------------------- MV Septal e' Velocity             4.6 cm/s         >=8.0 MV E/e' (Septal)                      19.0               MV Lateral e' Velocity            9.1 cm/s        >=10.0 MV E/e' (Lateral)                      9.5               MV e' Average                     6.9 cm/s               MV E/e' (Average)                     14.3 Tricuspid Valve ---------------------------------------------------------------------- Name                                 Value        Normal ---------------------------------------------------------------------- TV Regurgitation Doppler ---------------------------------------------------------------------- TR Peak Velocity                   2.7 m/s               Estimated PAP/RSVP ---------------------------------------------------------------------- RA Pressure                         3 mmHg           <=5 RV Systolic Pressure               32 mmHg           <36 Pulmonic Valve ---------------------------------------------------------------------- Name                                 Value        Normal ---------------------------------------------------------------------- PV Doppler ---------------------------------------------------------------------- PV Peak  Velocity                   1.4 m/s Aorta ---------------------------------------------------------------------- Name                                 Value        Normal ---------------------------------------------------------------------- Ascending Aorta ---------------------------------------------------------------------- Ao Root Diameter (2D)               2.6 cm               Ao Root Diam Index (2D)          1.3 cm/m2 Venous ---------------------------------------------------------------------- Name                                 Value        Normal ---------------------------------------------------------------------- IVC/SVC ---------------------------------------------------------------------- IVC Diameter (Exp 2D)  1.1 cm         <=2.1 Report Signatures Finalized by Fairy Beverley Seton  MD on 04/07/2024 08:18 PM Resident Ezra JINNY Horner  MD on 04/07/2024 06:22 PM  ECG 12 Lead Result Date: 04/07/2024 SINUS RHYTHM WITH PREMATURE ATRIAL BEATS PROLONGED QT NONSPECIFIC ST AND T WAVE ABNORMALITY WHEN COMPARED WITH ECG OF 07-Apr-2024 01:01, RATE HAS DECREASED AND PREMATURE ATRIAL BEATS ARE NOW PRESENT POOR R WAVE PROGRESSION IN PRECORDIAL LEADS IS LESS EVIDENT Confirmed by Vinie Dunnings [phone]) on 04/07/2024 5:59:27 PM  ECG 12 Lead Result Date: 04/07/2024 SINUS TACHYCARDIA PROLONGED QT NONSPECIFIC ST-T WAVE ABNORMALITIES POOR R WAVE PROGRESSION IN PRECORDIAL LEADS WHEN COMPARED WITH ECG OF 06-Apr-2024 17:57, PREMATURE ATRIAL BEATS ARE NO LONGER PRESENT NO SIGNIFICANT CHANGE WAS FOUND Confirmed by Vinie Dunnings ([id number]) on 04/07/2024 2:43:42 PM  ECG 12 Lead Result Date: 04/07/2024 SINUS RHYTHM WITH PREMATURE ATRIAL BEATS PROLONGED QT NONSPECIFIC ST AND T WAVE ABNORMALITY POOR R WAVE PROGRESSION IN PRECORDIAL LEADS , CONSIDER LEAD PLACEMENT , ANTEROSEPTAL INFARCT , OLD WHEN COMPARED WITH ECG OF 06-Apr-2024 17:56, NON-SPECIFIC ST/T WAVE CHANGES ARE LESS EVIDENT Confirmed by Vinie Dunnings  ([id number]) on 04/07/2024 2:19:18 PM  XR Abdomen 1 View Result Date: 04/07/2024 EXAM: XR ABDOMEN 1 VIEW DATE: 04/07/2024 3:21 AM ACCESSION: [id number] UN DICTATED: 04/07/2024 3:50 AM INTERPRETATION LOCATION: MAIN CAMPUS CLINICAL INDICATION: 73 years old Female with ABDOMINAL PAIN  -  UNSPECIFIED SITE  COMPARISON: XR ABDOMEN 1 VIEW 03/17/2024 TECHNIQUE: Supine view of the abdomen. 2 images FINDINGS: Peritoneal dialysis catheter projects over the midline pelvis. Nonobstructive bowel gas pattern with centralization of bowel loops. No acute osseous abnormalities. Lung bases not well visualized.   Nonobstructive bowel gas pattern with centralization of bowel loops, likely reflecting ascites.   ECG 12 Lead Result Date: 04/06/2024 SINUS TACHYCARDIA WITH PREMATURE ATRIAL BEATS LOW VOLTAGE QRS NON-SPECIFIC ST/T WAVE CHANGES ABNORMAL ECG WHEN COMPARED WITH ECG OF 05-Apr-2024 16:50, NONSPECIFIC T WAVE ABNORMALITY NOW EVIDENT IN INFERIOR LEADS NONSPECIFIC T WAVE ABNORMALITY, WORSE IN ANTEROLATERAL LEADS Confirmed by Cleotilde Moccasin ([id number]) on 04/06/2024 1:51:10 PM  ECG 12 Lead Result Date: 04/06/2024 SINUS TACHYCARDIA WITH PREMATURE ATRIAL BEATS NON-SPECIFIC ST/T WAVE CHANGES ABNORMAL ECG WHEN COMPARED WITH ECG OF 15-Mar-2024 11:08, PREMATURE ATRIAL BEATS ARE NOW PRESENT VENT. RATE HAS INCREASED by  44 bpm NONSPECIFIC T WAVE ABNORMALITY NO LONGER EVIDENT IN ANTERIOR LEADS NONSPECIFIC T WAVE ABNORMALITY, WORSE IN LATERAL LEADS Confirmed by Cleotilde Moccasin [phone]) on 04/06/2024 11:49:59 AM  XR Chest 1 view Result Date: 04/06/2024 EXAM: XR CHEST 1 VIEW ACCESSION: [id number] UN REPORT DATE: 04/06/2024 3:56 AM CLINICAL INDICATION: SHORTNESS OF BREATH  TECHNIQUE: Single View AP Chest Radiograph. COMPARISON: Chest radiograph 03/24/2024 FINDINGS: Interval removal of the prior right internal jugular catheter. Lungs are low in volume with bibasilar atelectasis and bronchovascular crowding. No pleural effusion or pneumothorax.  Normal heart size and mediastinal contours.   No acute abnormalities.   ______________________________________________________________________   Appointments which have been scheduled for you    Jul 10, 2024 10:00 AM (Arrive by 9:45 AM) RETURN RHEUMATOLOGY with Nat Earnie Baumgartner, DO [facility] RHEUMATOLOGY SPECIALTY [place] [facility]) [address] [facility] 1 through [address] [place] [address] [phone]        ______________________________________________________________________ Discharge Day Services: BP 115/55   Pulse 79   Temp 37 C (98.6 F) (Oral)   Resp 18   SpO2 96%   Pt seen on the day of discharge and determined appropriate for discharge.  Condition at Discharge:  stable  Length of Discharge: I spent greater than 30 mins in the discharge of this patient.

## 2024-04-23 NOTE — Unmapped External Note (Signed)
 [facility] INTERVENTIONAL NEPHROLOGY - Operative Note   Post-Procedure Note  Procedure Name: Successful exchange of right internal jugular vein tunneled hemodialysis catheter  Pre-Op Diagnosis: ESRD patient with malfunctioning right IJ tunneled dialysis catheter  Post-Op Diagnosis: Same as pre-operative diagnosis  Attending Physician:  Roetta Gatha Said, MD  Time out: Prior to the procedure, a time out was performed with all team members present. During the time out, the patient, procedure and procedure site when applicable were verbally verified by the team members and Roetta Gatha Said, MD.  Description of procedure: Successful exchange of the right internal jugular vein tunneled hemodialysis catheter with a new 19 cm glidepath 14.5 French dual-lumen hemodialysis catheter with its tip in the right atrium.  Plan: Okay to use the hemodialysis catheter for hemodialysis immediately.  Sedation:Single agent anxiolysis  Estimated Blood Loss: approximately <5 mL Complications: None  See detailed procedure note with images in PACS [facility]).  The patient tolerated the procedure well without incident or complication and left the room in stable condition.  Roetta Gatha Said, MD 04/23/2024 12:06 PM

## 2024-04-23 NOTE — Progress Notes (Signed)
 Dressing clean, dry, and intact.  Discharge instructions reviewed and given to pt and family.  Understanding verbalized.  Pt ambulatory without assistance.  Discharged from PRU in stable condition.

## 2024-05-06 ENCOUNTER — Inpatient Hospital Stay
Admission: EM | Admit: 2024-05-06 | Discharge: 2024-05-15 | DRG: 919 | Disposition: A | Discharge status: Skilled Nursing Facility | Source: Skilled Nursing Facility | Attending: Internal Medicine | Admitting: Internal Medicine

## 2024-05-06 ENCOUNTER — Emergency Department

## 2024-05-06 ENCOUNTER — Other Ambulatory Visit: Payer: Self-pay

## 2024-05-06 DIAGNOSIS — I953 Hypotension of hemodialysis: Secondary | ICD-10-CM | POA: Diagnosis present

## 2024-05-06 DIAGNOSIS — E162 Hypoglycemia, unspecified: Secondary | ICD-10-CM | POA: Diagnosis present

## 2024-05-06 DIAGNOSIS — E66811 Obesity, class 1: Secondary | ICD-10-CM | POA: Diagnosis present

## 2024-05-06 DIAGNOSIS — T8571XA Infection and inflammatory reaction due to peritoneal dialysis catheter, initial encounter: Principal | ICD-10-CM | POA: Diagnosis present

## 2024-05-06 DIAGNOSIS — Z888 Allergy status to other drugs, medicaments and biological substances status: Secondary | ICD-10-CM

## 2024-05-06 DIAGNOSIS — A419 Sepsis, unspecified organism: Secondary | ICD-10-CM | POA: Diagnosis present

## 2024-05-06 DIAGNOSIS — Z992 Dependence on renal dialysis: Secondary | ICD-10-CM

## 2024-05-06 DIAGNOSIS — E871 Hypo-osmolality and hyponatremia: Secondary | ICD-10-CM | POA: Diagnosis not present

## 2024-05-06 DIAGNOSIS — Z1152 Encounter for screening for COVID-19: Secondary | ICD-10-CM

## 2024-05-06 DIAGNOSIS — Z7952 Long term (current) use of systemic steroids: Secondary | ICD-10-CM

## 2024-05-06 DIAGNOSIS — K659 Peritonitis, unspecified: Secondary | ICD-10-CM | POA: Diagnosis present

## 2024-05-06 DIAGNOSIS — Z8719 Personal history of other diseases of the digestive system: Secondary | ICD-10-CM

## 2024-05-06 DIAGNOSIS — Z79899 Other long term (current) drug therapy: Secondary | ICD-10-CM

## 2024-05-06 DIAGNOSIS — I2489 Other forms of acute ischemic heart disease: Secondary | ICD-10-CM | POA: Diagnosis present

## 2024-05-06 DIAGNOSIS — N186 End stage renal disease: Secondary | ICD-10-CM | POA: Diagnosis present

## 2024-05-06 DIAGNOSIS — Z862 Personal history of diseases of the blood and blood-forming organs and certain disorders involving the immune mechanism: Secondary | ICD-10-CM

## 2024-05-06 DIAGNOSIS — N2581 Secondary hyperparathyroidism of renal origin: Secondary | ICD-10-CM | POA: Diagnosis present

## 2024-05-06 DIAGNOSIS — E2740 Unspecified adrenocortical insufficiency: Secondary | ICD-10-CM | POA: Diagnosis present

## 2024-05-06 DIAGNOSIS — Z803 Family history of malignant neoplasm of breast: Secondary | ICD-10-CM

## 2024-05-06 DIAGNOSIS — D631 Anemia in chronic kidney disease: Secondary | ICD-10-CM | POA: Diagnosis present

## 2024-05-06 DIAGNOSIS — Z6833 Body mass index (BMI) 33.0-33.9, adult: Secondary | ICD-10-CM

## 2024-05-06 DIAGNOSIS — I959 Hypotension, unspecified: Principal | ICD-10-CM

## 2024-05-06 DIAGNOSIS — I480 Paroxysmal atrial fibrillation: Secondary | ICD-10-CM | POA: Diagnosis present

## 2024-05-06 DIAGNOSIS — R6521 Severe sepsis with septic shock: Secondary | ICD-10-CM | POA: Diagnosis present

## 2024-05-06 DIAGNOSIS — M329 Systemic lupus erythematosus, unspecified: Secondary | ICD-10-CM | POA: Diagnosis present

## 2024-05-06 DIAGNOSIS — E876 Hypokalemia: Secondary | ICD-10-CM | POA: Diagnosis not present

## 2024-05-06 LAB — COMPREHENSIVE METABOLIC PANEL WITH GFR
ALT: 20 U/L (ref 0–44)
AST: 65 U/L — ABNORMAL HIGH (ref 15–41)
Albumin: 4 g/dL (ref 3.5–5.0)
Alkaline Phosphatase: 84 U/L (ref 38–126)
Anion gap: 13 (ref 5–15)
BUN: 8 mg/dL (ref 8–23)
CO2: 31 mmol/L (ref 22–32)
Calcium: 9.3 mg/dL (ref 8.9–10.3)
Chloride: 97 mmol/L — ABNORMAL LOW (ref 98–111)
Creatinine, Ser: 3.76 mg/dL — ABNORMAL HIGH (ref 0.44–1.00)
GFR, Estimated: 12 mL/min — ABNORMAL LOW (ref >60)
Glucose, Bld: 95 mg/dL (ref 70–99)
Potassium: 3.7 mmol/L (ref 3.5–5.1)
Sodium: 141 mmol/L (ref 135–145)
Total Bilirubin: 0.4 mg/dL (ref 0.0–1.2)
Total Protein: 7.2 g/dL (ref 6.5–8.1)

## 2024-05-06 LAB — CBC WITH DIFFERENTIAL/PLATELET
Abs Immature Granulocytes: 0.14 K/uL — ABNORMAL HIGH (ref 0.00–0.07)
Basophils Absolute: 0.1 K/uL (ref 0.0–0.1)
Basophils Relative: 0 %
Eosinophils Absolute: 0.1 K/uL (ref 0.0–0.5)
Eosinophils Relative: 0 %
HCT: 35.7 % — ABNORMAL LOW (ref 36.0–46.0)
Hemoglobin: 10.9 g/dL — ABNORMAL LOW (ref 12.0–15.0)
Immature Granulocytes: 1 %
Lymphocytes Relative: 8 %
Lymphs Abs: 1.3 K/uL (ref 0.7–4.0)
MCH: 31.2 pg (ref 26.0–34.0)
MCHC: 30.5 g/dL (ref 30.0–36.0)
MCV: 102.3 fL — ABNORMAL HIGH (ref 80.0–100.0)
Monocytes Absolute: 0.8 K/uL (ref 0.1–1.0)
Monocytes Relative: 5 %
Neutro Abs: 13.3 K/uL — ABNORMAL HIGH (ref 1.7–7.7)
Neutrophils Relative %: 86 %
Platelets: 347 K/uL (ref 150–400)
RBC: 3.49 MIL/uL — ABNORMAL LOW (ref 3.87–5.11)
RDW: 16.9 % — ABNORMAL HIGH (ref 11.5–15.5)
WBC: 15.7 K/uL — ABNORMAL HIGH (ref 4.0–10.5)
nRBC: 1.1 % — ABNORMAL HIGH (ref 0.0–0.2)

## 2024-05-06 LAB — MAGNESIUM: Magnesium: 1.7 mg/dL (ref 1.7–2.4)

## 2024-05-06 LAB — CBG MONITORING, ED
Glucose-Capillary: 62 mg/dL — ABNORMAL LOW (ref 70–99)
Glucose-Capillary: 58 mg/dL — ABNORMAL LOW (ref 70–99)
Glucose-Capillary: 168 mg/dL — ABNORMAL HIGH (ref 70–99)

## 2024-05-06 LAB — LACTIC ACID, PLASMA
Lactic Acid, Venous: 2.9 mmol/L (ref 0.5–1.9)
Lactic Acid, Venous: 3.5 mmol/L (ref 0.5–1.9)

## 2024-05-06 MED ORDER — FLUDROCORTISONE ACETATE 0.1 MG PO TABS: 0.1000 mg | ORAL_TABLET | Freq: Every day | ORAL | Status: DC

## 2024-05-06 MED ORDER — ONDANSETRON 4 MG PO TBDP
4.0000 mg | ORAL_TABLET | Freq: Once | ORAL | Status: DC
  Filled 2024-05-06: qty 1

## 2024-05-06 MED ORDER — ONDANSETRON HCL 4 MG PO TABS: 4.0000 mg | ORAL_TABLET | Freq: Four times a day (QID) | ORAL | Status: DC | PRN

## 2024-05-06 MED ORDER — SODIUM CHLORIDE 0.9 % IV SOLN
2.0000 g | INTRAVENOUS | Status: DC
  Administered 2024-05-06: 2 g via INTRAVENOUS
  Filled 2024-05-06: qty 20

## 2024-05-06 MED ORDER — GABAPENTIN 300 MG PO CAPS
300.0000 mg | ORAL_CAPSULE | Freq: Two times a day (BID) | ORAL | Status: DC
  Administered 2024-05-06 – 2024-05-14 (×15): 300 mg via ORAL
  Filled 2024-05-06 (×15): qty 1

## 2024-05-06 MED ORDER — SODIUM CHLORIDE 0.9 % IV BOLUS
250.0000 mL | Freq: Once | INTRAVENOUS | Status: AC
  Administered 2024-05-06: 250 mL via INTRAVENOUS

## 2024-05-06 MED ORDER — SODIUM CHLORIDE 0.9 % IV BOLUS
500.0000 mL | Freq: Once | INTRAVENOUS | Status: AC
  Administered 2024-05-06: 500 mL via INTRAVENOUS

## 2024-05-06 MED ORDER — METRONIDAZOLE 500 MG/100ML IV SOLN
500.0000 mg | Freq: Two times a day (BID) | INTRAVENOUS | Status: DC
  Administered 2024-05-06: 500 mg via INTRAVENOUS
  Filled 2024-05-06 (×2): qty 100

## 2024-05-06 MED ORDER — DEXTROSE-SODIUM CHLORIDE 5-0.9 % IV SOLN: INTRAVENOUS | Status: AC

## 2024-05-06 MED ORDER — HEPARIN SODIUM (PORCINE) 5000 UNIT/ML IJ SOLN
5000.0000 [IU] | Freq: Three times a day (TID) | INTRAMUSCULAR | Status: DC
  Administered 2024-05-06 – 2024-05-15 (×25): 5000 [IU] via SUBCUTANEOUS
  Filled 2024-05-06 (×25): qty 1

## 2024-05-06 MED ORDER — ONDANSETRON HCL 4 MG/2ML IJ SOLN
4.0000 mg | Freq: Four times a day (QID) | INTRAMUSCULAR | Status: DC | PRN
  Administered 2024-05-07 – 2024-05-09 (×2): 4 mg via INTRAVENOUS
  Filled 2024-05-06 (×2): qty 2

## 2024-05-06 MED ORDER — MIDODRINE HCL 5 MG PO TABS
20.0000 mg | ORAL_TABLET | Freq: Three times a day (TID) | ORAL | Status: DC
  Administered 2024-05-06 – 2024-05-15 (×25): 20 mg via ORAL
  Filled 2024-05-06 (×23): qty 4

## 2024-05-06 MED ORDER — FLUTICASONE FUROATE-VILANTEROL 100-25 MCG/ACT IN AEPB
1.0000 | INHALATION_SPRAY | Freq: Every day | RESPIRATORY_TRACT | Status: DC
  Administered 2024-05-09 – 2024-05-14 (×6): 1 via RESPIRATORY_TRACT
  Filled 2024-05-06 (×2): qty 28

## 2024-05-06 MED ORDER — DEXTROSE 50 % IV SOLN
1.0000 | Freq: Once | INTRAVENOUS | Status: AC
  Administered 2024-05-06: 50 mL via INTRAVENOUS
  Filled 2024-05-06: qty 50

## 2024-05-06 MED ORDER — ACETAMINOPHEN 325 MG PO TABS: 650.0000 mg | ORAL_TABLET | Freq: Four times a day (QID) | ORAL | Status: DC | PRN

## 2024-05-06 MED ORDER — HYDROCODONE-ACETAMINOPHEN 5-325 MG PO TABS
1.0000 | ORAL_TABLET | ORAL | Status: DC | PRN
  Administered 2024-05-07 – 2024-05-10 (×2): 2 via ORAL
  Filled 2024-05-06 (×2): qty 2

## 2024-05-06 MED ORDER — ATORVASTATIN CALCIUM 20 MG PO TABS
20.0000 mg | ORAL_TABLET | Freq: Every day | ORAL | Status: DC
  Administered 2024-05-07 – 2024-05-15 (×9): 20 mg via ORAL
  Filled 2024-05-06 (×9): qty 1

## 2024-05-06 MED ORDER — CINACALCET HCL 30 MG PO TABS
60.0000 mg | ORAL_TABLET | Freq: Every day | ORAL | Status: DC
  Administered 2024-05-08 – 2024-05-14 (×7): 60 mg via ORAL
  Filled 2024-05-06 (×10): qty 2

## 2024-05-06 MED ORDER — ONDANSETRON HCL 4 MG/2ML IJ SOLN
4.0000 mg | Freq: Once | INTRAMUSCULAR | Status: DC
  Administered 2024-05-06: 4 mg via INTRAVENOUS

## 2024-05-06 MED ORDER — HYDROXYCHLOROQUINE SULFATE 200 MG PO TABS
200.0000 mg | ORAL_TABLET | Freq: Every day | ORAL | Status: DC
  Filled 2024-05-06: qty 1

## 2024-05-06 MED ORDER — FLUDROCORTISONE ACETATE 0.1 MG PO TABS
0.1000 mg | ORAL_TABLET | Freq: Every day | ORAL | Status: DC
  Administered 2024-05-06 – 2024-05-12 (×6): 0.1 mg via ORAL
  Filled 2024-05-06 (×7): qty 1

## 2024-05-06 MED ORDER — ACETAMINOPHEN 650 MG RE SUPP
650.0000 mg | Freq: Four times a day (QID) | RECTAL | Status: DC | PRN
Start: 2024-05-06 — End: 2024-05-16

## 2024-05-06 NOTE — H&P (Signed)
 History and Physical    Patient: Cynthia Harvey FMW:[id number] DOB: 08-16-1950 DOA: 05/06/2024 DOS: the patient was seen and examined on 05/06/2024 PCP: Henry Fitch, MD  Patient coming from: Home  Chief Complaint:  Chief Complaint  Patient presents with   Hypotension    HPI: Cynthia Harvey is a 73 y.o. female with medical history significant for SLE, complicated by ESRD recently transitioned from PD to HD during a hospitalization at [facility] (10/11 to 04/21/2024) for PD associated Pseudomonas peritonitis(meropenem-> Cipro, completed 04/28/2024), brief paroxysmal A-fib during that hospitalization (not placed on [facility]), chronic hypotension on midodrine and Florinef being admitted with concern for sepsis, suspect from SBP in view of recent history. She presented from the dialysis center where she was noted to be persistently hypotensive with SBP in the 80s, and hypoglycemic with blood sugar 54.  Orange juice was administered for hypoglycemia but she subsequently vomited.  Patient reports ongoing abdominal discomfort but has not recently worsened.  She denies fever or chills.  Additionally, during her recent hospitalization she had recurring problems with hypotension/hypoglycemia s/p cortisol stimulation test with normal cortisol values. In the ED afebrile, tachycardic to up to 120 with initial BP 85/54, O2 sats 100% on room air Labs notable for WBC 15,000(up from 8.1 a couple weeks prior) and lactic acid 2.9 Blood glucose 95 Hemoglobin at baseline at 10.9 CMP unremarkable for known hemodialysis status EKG showed sinus at 95 and nonspecific ST-T wave changes. Chest x-ray nonacute  Patient was treated with Zofran and a small fluid bolus, D50, given her home dose of Florinef which was due.  Admission requested    Social History:  reports that she has never smoked. She has never used smokeless tobacco. She reports that she does not drink alcohol and does not use drugs.  No Known  Allergies  Family History  Problem Relation Age of Onset   Breast cancer Mother 83    Prior to Admission medications   Not on File    Physical Exam: Vitals:   05/06/24 1443 05/06/24 1450 05/06/24 1710 05/06/24 1830  BP:  (!) 85/54 (!) 94/54 (!) 86/51  Pulse: 100 (!) 106 (!) 108 99  Resp: 17  16 17   Temp: 97.7 F (36.5 C)  98.2 F (36.8 C)   TempSrc: Oral  Oral   SpO2: 96% 100% 100% 100%  Weight:      Height:       Physical Exam Vitals and nursing note reviewed.  Constitutional:      General: She is not in acute distress. HENT:     Head: Normocephalic and atraumatic.  Cardiovascular:     Rate and Rhythm: Regular rhythm. Tachycardia present.     Heart sounds: Normal heart sounds.  Pulmonary:     Effort: Pulmonary effort is normal.     Breath sounds: Normal breath sounds.  Abdominal:     Palpations: Abdomen is soft.     Tenderness: There is no abdominal tenderness.  Neurological:     Mental Status: Mental status is at baseline.     Labs on Admission: I have personally reviewed following labs and imaging studies  CBC: Recent Labs  Lab 05/06/24 1509  WBC 15.7*  NEUTROABS 13.3*  HGB 10.9*  HCT 35.7*  MCV 102.3*  PLT 347   Basic Metabolic Panel: Recent Labs  Lab 05/06/24 1509  NA 141  K 3.7  CL 97*  CO2 31  GLUCOSE 95  BUN 8  CREATININE 3.76*  CALCIUM 9.3  MG 1.7   GFR: Estimated Creatinine Clearance: 11.1 mL/min (A) (by C-G formula based on SCr of 3.76 mg/dL (H)). Liver Function Tests: Recent Labs  Lab 05/06/24 1509  AST 65*  ALT 20  ALKPHOS 84  BILITOT 0.4  PROT 7.2  ALBUMIN 4.0   No results for input(s): LIPASE, AMYLASE in the last 168 hours. No results for input(s): AMMONIA in the last 168 hours. Coagulation Profile: No results for input(s): INR, PROTIME in the last 168 hours. Cardiac Enzymes: No results for input(s): CKTOTAL, CKMB, CKMBINDEX, TROPONINI in the last 168 hours. BNP (last 3 results) No results for  input(s): PROBNP in the last 8760 hours. HbA1C: No results for input(s): HGBA1C in the last 72 hours. CBG: Recent Labs  Lab 05/06/24 1505 05/06/24 1713 05/06/24 1851  GLUCAP 62* 58* 168*   Lipid Profile: No results for input(s): CHOL, HDL, LDLCALC, TRIG, CHOLHDL, LDLDIRECT in the last 72 hours. Thyroid  Function Tests: No results for input(s): TSH, T4TOTAL, FREET4, T3FREE, THYROIDAB in the last 72 hours. Anemia Panel: No results for input(s): VITAMINB12, FOLATE, FERRITIN, TIBC, IRON, RETICCTPCT in the last 72 hours. Urine analysis: No results found for: COLORURINE, APPEARANCEUR, LABSPEC, PHURINE, GLUCOSEU, HGBUR, BILIRUBINUR, KETONESUR, PROTEINUR, UROBILINOGEN, NITRITE, LEUKOCYTESUR  Radiological Exams on Admission: DG Chest Port 1 View Result Date: 05/06/2024 CLINICAL DATA:  Hypotension. EXAM: PORTABLE CHEST 1 VIEW COMPARISON:  None Available. FINDINGS: Right IJ central venous catheter has tip over the SVC just above the cavoatrial junction. Lungs are hypoinflated and otherwise clear. Cardiomediastinal silhouette is normal. Mild prominent overlying soft tissues. Bony structures are unremarkable. IMPRESSION: Hypoinflation without acute cardiopulmonary disease. Electronically Signed   By: Toribio Agreste M.D.   On: 05/06/2024 18:37   Data Reviewed for HPI: Relevant notes from primary care and specialist visits, past discharge summaries as available in EHR, including Care Everywhere. Prior diagnostic testing as pertinent to current admission diagnoses Updated medications and problem lists for reconciliation ED course, including vitals, labs, imaging, treatment and response to treatment Triage notes, nursing and pharmacy notes and ED provider's notes Notable results as noted above in HPI      Assessment and Plan: * Sepsis (HCC) # Suspect bacterial peritonitis # Recent PD catheter associated Pseudomonas peritonitis s/p  catheter removal(meropenem-> Cipro, completed 04/28/2024) #Hx of Recurrent inpatient hypotension/hypoglycemia (10/11-11/3/25), normal cortisol Sepsis criteria include hypotension, tachycardia, leukocytosis and lactic acidosis and hypoglycemia Patient symptomatic for ongoing abdominal discomfort, occasional vomiting WBC 15,000, up from 8.1 a couple weeks prior and lactic acid 2.9 D5 NS infusion with as needed fluid boluses(given dialysis status) to keep MAP over 65  Continue midodrine and Florinef Pain control and antiemetics CBG every 2 hours Rocephin and Flagyl for empiric SBP treatment (previously treated with meropenem-<Cipro, completed 04/28/2024) Will arrange IR consult for paracentesis for ascitic fluid studies Nephrology consult for continuation of dialysis N.p.o. after midnight for possible paracentesis  Hypotension, acute on chronic Recurrent hypoglycemia without history of diabetes Had cortisol stimulation test while at [facility] a month ago that was negative D5NS with frequent CBGs Continue Florinef and midodrine Continue evaluating for sepsis as above  ESRD on hemodialysis [facility]) Nephrology consult for continuation of dialysis  History of anemia due to chronic kidney disease Hemoglobin stable Continue to monitor  Systemic lupus erythematosus (HCC) No acute issues suspected Continue Plaquenil        DVT prophylaxis: SCD for possible paracentesis in the am  Consults: IR, renal  Advance Care Planning:   Code Status: Full Code   Family  Communication: none  Disposition Plan: Back to previous home environment  Severity of Illness: The appropriate patient status for this patient is OBSERVATION. Observation status is judged to be reasonable and necessary in order to provide the required intensity of service to ensure the patient's safety. The patient's presenting symptoms, physical exam findings, and initial radiographic and laboratory data in the context of their medical  condition is felt to place them at decreased risk for further clinical deterioration. Furthermore, it is anticipated that the patient will be medically stable for discharge from the hospital within 2 midnights of admission.   Author: Delayne LULLA Solian, MD 05/06/2024 8:02 PM  For on call review [email or web address].

## 2024-05-06 NOTE — Assessment & Plan Note (Signed)
 No acute issues suspected Continue Plaquenil

## 2024-05-06 NOTE — ED Notes (Signed)
 IV ultrasound team at bedside

## 2024-05-06 NOTE — ED Notes (Signed)
 Lab called to collect blood culture set due to patient being a hard stick.

## 2024-05-06 NOTE — ED Notes (Signed)
Patient given crackers and juice at this time.

## 2024-05-06 NOTE — Assessment & Plan Note (Signed)
Nephrology consult for continuation of dialysis 

## 2024-05-06 NOTE — Hospital Course (Signed)
 [name]

## 2024-05-06 NOTE — ED Triage Notes (Signed)
 Pt to ED via [facility] from [facility]. Pt had dialysis treatment today. Per facility staff, pt's BP dropped after treatment, medication administered. BP not responsive to meds administered. Staff stated pt's cbg 54, pt given 2 cups oj, which pt immediately threw up. States pt unable to keep anything down for two days.  Last BP with EMS 103/77, cbg 82

## 2024-05-06 NOTE — ED Notes (Signed)
 Date and time results received: 05/06/24 1822  Test: Lactic acid Critical Value: 2.9  Name of Provider Notified: MD [facility]

## 2024-05-06 NOTE — Assessment & Plan Note (Addendum)
#   Suspect bacterial peritonitis # Recent PD catheter associated Pseudomonas peritonitis s/p catheter removal(meropenem-> Cipro, completed 04/28/2024) #Hx of Recurrent inpatient hypotension/hypoglycemia (10/11-11/3/25), normal cortisol Sepsis criteria include hypotension, tachycardia, leukocytosis and lactic acidosis and hypoglycemia Patient symptomatic for ongoing abdominal discomfort, occasional vomiting WBC 15,000, up from 8.1 a couple weeks prior and lactic acid 2.9 D5 NS infusion with as needed fluid boluses(given dialysis status) to keep MAP over 65  Continue midodrine and Florinef Pain control and antiemetics CBG every 2 hours Rocephin and Flagyl for empiric SBP treatment (previously treated with meropenem-<Cipro, completed 04/28/2024) Will arrange IR consult for paracentesis for ascitic fluid studies Nephrology consult for continuation of dialysis N.p.o. after midnight for possible paracentesis

## 2024-05-06 NOTE — ED Provider Notes (Signed)
-----------------------------------------   7:41 PM on 05/06/2024 -----------------------------------------   I took over care of this patient from Dr. Ernest.  The patient has remained persistently hypotensive although the blood pressure has improved somewhat.  I ordered a dose of her Florinef as well as additional fluids.  I added on a chest x-ray and there is no evidence of fluid overload.  CBC shows leukocytosis.  Lactate is slightly elevated.  The patient remains asymptomatic and appears well.  I have a lower suspicion for sepsis given the lack of any acute symptoms.  She is tolerating p.o.  However, given the persistent hypotension she will need further management in the hospital.  I consulted Dr. Cleatus from the hospitalist service; based on our discussion she agrees to evaluate the patient for admission.   Cynthia Pae, MD 05/06/24 [phone]

## 2024-05-06 NOTE — Assessment & Plan Note (Signed)
 Hemoglobin stable.  Continue to monitor

## 2024-05-06 NOTE — ED Notes (Signed)
 Lab called to collect blood work due to patient being difficult stick

## 2024-05-06 NOTE — Assessment & Plan Note (Addendum)
 Recurrent hypoglycemia without history of diabetes Had cortisol stimulation test while at [facility] a month ago that was negative D5NS with frequent CBGs Continue Florinef and midodrine Continue evaluating for sepsis as above

## 2024-05-06 NOTE — ED Notes (Signed)
 CCMD called at this time to admit pt for tele monitoring

## 2024-05-06 NOTE — ED Notes (Addendum)
 Dr. Cleatus made aware of pt's BP

## 2024-05-06 NOTE — ED Provider Notes (Signed)
 [facility] Provider Note    Event Date/Time   First MD Initiated Contact with Patient 05/06/24 1438     (approximate)   History   Hypotension   HPI  Cynthia Harvey is a 73 y.o. female who comes in from [facility].  Patient did have a complete dialysis treatment today.  When they got back to the facility she noticed her blood pressures were low.  They gave her her fludrocortisone and midodrine 20 mg.  They also checked a glucose and her sugar was 54 so they had her drink 3 cups of OJ.  Patient reports that the taste of the OJ was bad and that she just drank too much of it which meant that she started to throw up.  She contrary to triage note denies any issues with vomiting prior to the orange juice.  She states that she has been eating and drinking okay denies any abdominal pain or falls or hitting her head or any other concerns.  She has no chest pain, shortness of breath.   Physical Exam   Triage Vital Signs: ED Triage Vitals  Encounter Vitals Group     BP --      Girls Systolic BP Percentile --      Girls Diastolic BP Percentile --      Boys Systolic BP Percentile --      Boys Diastolic BP Percentile --      Pulse --      Resp --      Temp --      Temp src --      SpO2 --      Weight 05/06/24 1442 155 lb (70.3 kg)     Height 05/06/24 1442 4' 9 (1.448 m)     Head Circumference --      [facility] Flow --      Pain Score 05/06/24 1437 0     Pain Loc --      Pain Education --      Exclude from Growth Chart --     Most recent vital signs: There were no vitals filed for this visit.   General: Awake, no distress.  CV:  Good peripheral perfusion.  Resp:  Normal effort.  Abd:  No distention.  Soft and nontender Other:  Dialysis catheter in right chest wall.   ED Results / Procedures / Treatments   Labs (all labs ordered are listed, but only abnormal results are displayed) Labs Reviewed  CBC WITH DIFFERENTIAL/PLATELET  COMPREHENSIVE  METABOLIC PANEL WITH GFR  CBG MONITORING, ED     EKG  My interpretation of EKG:    RADIOLOGY None    PROCEDURES:  Critical Care performed: No  .1-3 Lead EKG Interpretation  Performed by: Ernest Ronal BRAVO, MD Authorized by: Ernest Ronal BRAVO, MD     Interpretation: normal     ECG rate:  80   ECG rate assessment: normal     Rhythm: sinus rhythm     Ectopy: none     Conduction: normal      MEDICATIONS ORDERED IN ED: Medications - No data to display   IMPRESSION / MDM / ASSESSMENT AND PLAN / ED COURSE  I reviewed the triage vital signs and the nursing notes.   Patient's presentation is most consistent with acute presentation with potential threat to life or bodily function.   Patient comes in with concerns for hypoglycemia as well as low blood pressures after dialysis.  Suspect it  was from taking off too much fluid but patient also has a history of hypotension and is on chronic midodrine and fludrocortisone for.  I reviewed a note from 03/13/2024 where she was admitted for similarly.  Patient was discharged on midodrine 20 and Florinef.  Patient denies any abdominal pain to suggest acute abdominal pathology.  She is well-appearing otherwise and denies any chest pain or shortness of breath to suggest ACS.  Will get blood work to evaluate, EKG and patient to oncoming team pending these results.  Patient's glucose was low at 60 but patient is mentating well.  Will give some p.o. Zofran and do p.o. challenge.  Patient IV access is being worked on for checking labs I did order EKG to evaluate for any arrhythmia.  Given the blood pressure is still slightly low I have ordered a small fluid bolus of 250 cc.  Patient will be handed off pending the rest of her results.  The patient is on the cardiac monitor to evaluate for evidence of arrhythmia and/or significant heart rate changes.      FINAL CLINICAL IMPRESSION(S) / ED DIAGNOSES   Final diagnoses:  Hypotension, unspecified  hypotension type  Hypoglycemia     Rx / DC Orders   ED Discharge Orders     None        Note:  This document was prepared using Dragon voice recognition software and may include unintentional dictation errors.   Ernest Ronal BRAVO, MD 05/06/24 1520

## 2024-05-07 ENCOUNTER — Inpatient Hospital Stay

## 2024-05-07 DIAGNOSIS — Z888 Allergy status to other drugs, medicaments and biological substances status: Secondary | ICD-10-CM | POA: Diagnosis not present

## 2024-05-07 DIAGNOSIS — I953 Hypotension of hemodialysis: Secondary | ICD-10-CM | POA: Diagnosis present

## 2024-05-07 DIAGNOSIS — M329 Systemic lupus erythematosus, unspecified: Secondary | ICD-10-CM | POA: Diagnosis present

## 2024-05-07 DIAGNOSIS — E2740 Unspecified adrenocortical insufficiency: Secondary | ICD-10-CM | POA: Diagnosis present

## 2024-05-07 DIAGNOSIS — E162 Hypoglycemia, unspecified: Secondary | ICD-10-CM | POA: Diagnosis present

## 2024-05-07 DIAGNOSIS — R6521 Severe sepsis with septic shock: Secondary | ICD-10-CM

## 2024-05-07 DIAGNOSIS — R578 Other shock: Secondary | ICD-10-CM | POA: Diagnosis not present

## 2024-05-07 DIAGNOSIS — Z803 Family history of malignant neoplasm of breast: Secondary | ICD-10-CM | POA: Diagnosis not present

## 2024-05-07 DIAGNOSIS — A419 Sepsis, unspecified organism: Secondary | ICD-10-CM

## 2024-05-07 DIAGNOSIS — N186 End stage renal disease: Secondary | ICD-10-CM

## 2024-05-07 DIAGNOSIS — Z1152 Encounter for screening for COVID-19: Secondary | ICD-10-CM | POA: Diagnosis not present

## 2024-05-07 DIAGNOSIS — R579 Shock, unspecified: Secondary | ICD-10-CM | POA: Diagnosis not present

## 2024-05-07 DIAGNOSIS — E66811 Obesity, class 1: Secondary | ICD-10-CM | POA: Diagnosis present

## 2024-05-07 DIAGNOSIS — K659 Peritonitis, unspecified: Secondary | ICD-10-CM | POA: Diagnosis present

## 2024-05-07 DIAGNOSIS — Z992 Dependence on renal dialysis: Secondary | ICD-10-CM | POA: Diagnosis not present

## 2024-05-07 DIAGNOSIS — T8571XA Infection and inflammatory reaction due to peritoneal dialysis catheter, initial encounter: Secondary | ICD-10-CM | POA: Diagnosis present

## 2024-05-07 DIAGNOSIS — E876 Hypokalemia: Secondary | ICD-10-CM | POA: Diagnosis not present

## 2024-05-07 DIAGNOSIS — D631 Anemia in chronic kidney disease: Secondary | ICD-10-CM | POA: Diagnosis present

## 2024-05-07 DIAGNOSIS — I2489 Other forms of acute ischemic heart disease: Secondary | ICD-10-CM | POA: Diagnosis present

## 2024-05-07 DIAGNOSIS — Z7952 Long term (current) use of systemic steroids: Secondary | ICD-10-CM | POA: Diagnosis not present

## 2024-05-07 DIAGNOSIS — N2581 Secondary hyperparathyroidism of renal origin: Secondary | ICD-10-CM | POA: Diagnosis present

## 2024-05-07 DIAGNOSIS — Z79899 Other long term (current) drug therapy: Secondary | ICD-10-CM | POA: Diagnosis not present

## 2024-05-07 DIAGNOSIS — Z6833 Body mass index (BMI) 33.0-33.9, adult: Secondary | ICD-10-CM | POA: Diagnosis not present

## 2024-05-07 DIAGNOSIS — I480 Paroxysmal atrial fibrillation: Secondary | ICD-10-CM | POA: Diagnosis present

## 2024-05-07 DIAGNOSIS — E871 Hypo-osmolality and hyponatremia: Secondary | ICD-10-CM | POA: Diagnosis not present

## 2024-05-07 LAB — BASIC METABOLIC PANEL WITH GFR
Anion gap: 10 (ref 5–15)
BUN: 9 mg/dL (ref 8–23)
CO2: 27 mmol/L (ref 22–32)
Calcium: 8.4 mg/dL — ABNORMAL LOW (ref 8.9–10.3)
Chloride: 104 mmol/L (ref 98–111)
Creatinine, Ser: 4.75 mg/dL — ABNORMAL HIGH (ref 0.44–1.00)
GFR, Estimated: 9 mL/min — ABNORMAL LOW (ref >60)
Glucose, Bld: 76 mg/dL (ref 70–99)
Potassium: 3.3 mmol/L — ABNORMAL LOW (ref 3.5–5.1)
Sodium: 141 mmol/L (ref 135–145)

## 2024-05-07 LAB — GLUCOSE, CAPILLARY
Glucose-Capillary: 64 mg/dL — ABNORMAL LOW (ref 70–99)
Glucose-Capillary: 76 mg/dL (ref 70–99)
Glucose-Capillary: 79 mg/dL (ref 70–99)
Glucose-Capillary: 82 mg/dL (ref 70–99)
Glucose-Capillary: 86 mg/dL (ref 70–99)
Glucose-Capillary: 94 mg/dL (ref 70–99)
Glucose-Capillary: 97 mg/dL (ref 70–99)

## 2024-05-07 LAB — HEPATIC FUNCTION PANEL
ALT: 19 U/L (ref 0–44)
AST: 45 U/L — ABNORMAL HIGH (ref 15–41)
Albumin: 2.9 g/dL — ABNORMAL LOW (ref 3.5–5.0)
Alkaline Phosphatase: 70 U/L (ref 38–126)
Bilirubin, Direct: <0.1 mg/dL (ref 0.0–0.2)
Total Bilirubin: 0.3 mg/dL (ref 0.0–1.2)
Total Protein: 5.4 g/dL — ABNORMAL LOW (ref 6.5–8.1)

## 2024-05-07 LAB — TROPONIN T, HIGH SENSITIVITY
Troponin T High Sensitivity: 125 ng/L (ref 0–19)
Troponin T High Sensitivity: 149 ng/L (ref 0–19)

## 2024-05-07 LAB — CBC
HCT: 27.5 % — ABNORMAL LOW (ref 36.0–46.0)
Hemoglobin: 8.3 g/dL — ABNORMAL LOW (ref 12.0–15.0)
MCH: 31.4 pg (ref 26.0–34.0)
MCHC: 30.2 g/dL (ref 30.0–36.0)
MCV: 104.2 fL — ABNORMAL HIGH (ref 80.0–100.0)
Platelets: 255 K/uL (ref 150–400)
RBC: 2.64 MIL/uL — ABNORMAL LOW (ref 3.87–5.11)
RDW: 17.8 % — ABNORMAL HIGH (ref 11.5–15.5)
WBC: 18.1 K/uL — ABNORMAL HIGH (ref 4.0–10.5)
nRBC: 0.4 % — ABNORMAL HIGH (ref 0.0–0.2)

## 2024-05-07 LAB — LACTIC ACID, PLASMA: Lactic Acid, Venous: 1 mmol/L (ref 0.5–1.9)

## 2024-05-07 LAB — RESP PANEL BY RT-PCR (RSV, FLU A&B, COVID)  RVPGX2
Influenza A by PCR: NEGATIVE
Influenza B by PCR: NEGATIVE
Resp Syncytial Virus by PCR: NEGATIVE
SARS Coronavirus 2 by RT PCR: NEGATIVE

## 2024-05-07 LAB — PROTIME-INR
INR: 1 (ref 0.8–1.2)
Prothrombin Time: 13.3 s (ref 11.4–15.2)

## 2024-05-07 LAB — PHOSPHORUS: Phosphorus: 2 mg/dL — ABNORMAL LOW (ref 2.5–4.6)

## 2024-05-07 LAB — MAGNESIUM: Magnesium: 1.8 mg/dL (ref 1.7–2.4)

## 2024-05-07 LAB — CORTISOL-AM, BLOOD: Cortisol - AM: 13.3 ug/dL (ref 6.7–22.6)

## 2024-05-07 LAB — PROCALCITONIN: Procalcitonin: 1.16 ng/mL

## 2024-05-07 LAB — CBG MONITORING, ED: Glucose-Capillary: 74 mg/dL (ref 70–99)

## 2024-05-07 LAB — MRSA NEXT GEN BY PCR, NASAL: MRSA by PCR Next Gen: NOT DETECTED

## 2024-05-07 MED ORDER — VANCOMYCIN HCL 1500 MG/300ML IV SOLN
1500.0000 mg | Freq: Once | INTRAVENOUS | Status: AC
  Administered 2024-05-07: 1500 mg via INTRAVENOUS
  Filled 2024-05-07: qty 300

## 2024-05-07 MED ORDER — SODIUM CHLORIDE 0.9 % IV SOLN
500.0000 mg | Freq: Once | INTRAVENOUS | Status: AC
  Administered 2024-05-07: 500 mg via INTRAVENOUS
  Filled 2024-05-07: qty 10

## 2024-05-07 MED ORDER — SODIUM CHLORIDE 0.9 % IV SOLN
500.0000 mg | Freq: Every day | INTRAVENOUS | Status: AC
  Administered 2024-05-07 – 2024-05-13 (×7): 500 mg via INTRAVENOUS
  Filled 2024-05-07 (×4): qty 10
  Filled 2024-05-07: qty 500
  Filled 2024-05-07 (×2): qty 10
  Filled 2024-05-07: qty 500

## 2024-05-07 MED ORDER — CHLORHEXIDINE GLUCONATE CLOTH 2 % EX PADS
6.0000 | MEDICATED_PAD | Freq: Every day | CUTANEOUS | Status: DC
  Administered 2024-05-07 – 2024-05-15 (×9): 6 via TOPICAL
  Filled 2024-05-07: qty 6

## 2024-05-07 MED ORDER — SODIUM CHLORIDE 0.9 % IV SOLN: 250.0000 mL | INTRAVENOUS | Status: AC

## 2024-05-07 MED ORDER — NOREPINEPHRINE 4 MG/250ML-% IV SOLN
0.0000 ug/min | INTRAVENOUS | Status: DC
  Administered 2024-05-07: 2 ug/min via INTRAVENOUS
  Filled 2024-05-07: qty 250

## 2024-05-07 MED ORDER — MAGNESIUM SULFATE 2 GM/50ML IV SOLN
2.0000 g | Freq: Once | INTRAVENOUS | Status: AC
  Administered 2024-05-07: 2 g via INTRAVENOUS
  Filled 2024-05-07: qty 50

## 2024-05-07 NOTE — Consult Note (Signed)
 NAME:  Cynthia Harvey, MRN:  [id number], DOB:  [birth year], LOS: 0 ADMISSION DATE:  05/06/2024, CONSULTATION DATE: 05/07/2024 REFERRING MD: Dr. Marsa, CHIEF COMPLAINT: Hypotension   History of Present Illness:  This is a 73 yo female via EMS from [facility] who presented to [facility] ER on 11/12 following a dialysis treatment she developed hypotension when she got back to [facility].  At the facility she received her scheduled fludrocortisone and midodrine 20 mg.  She was also found to be hypoglycemic CBG 54, therefore she was instructed to drink 3 cups of orange juice.  However, she was unable to drink all of it because she began to vomit.    She was recently transitioned from PD to HD during hospitalization at [facility] on 04/05/2024 to 04/21/2024 via a right tunneled catheter placed on 04/09/24.  However, she later required right tunneled hemodialysis catheter exchange on 04/23/2024 due to malfunctioning catheter.     ED Course  Upon arrival to the ER vital signs were: temp 97.7 F/sbp 85/hr 106/O2 sats 100%.  Significant lab results were: chloride 97/creatinine 3.76/AST 65/wbc 15.7/hgb 10.9/lactic acid 2.9.  CXR negative acute cardiopulmonary abnormality.  EKG revealed NSR, hr 80, no signs of ST elevation. Pt received 250 ml bolus due to hypotension.  Pt admitted to the progressive care unit per hospitalist team, however remained in the ED pending bed availability.  See detailed hospital course below under significant events.    Pertinent  Medical History  ESRD (transitioned from PD to HD during hospitalization at [facility] 10/11 to 04/21/2024 due to PD associated pseudomonas peritonitis) Anemia Due to CKD  Systemic Lupus Erythematosus  Hypotension Hypoglycemia  Atrial Fibrillation  Obesity   Micro Data:   MRSA PCR 11/12>>negative  Blood x1 11/12>>NGTD  Blood x1 11/12>>  Significant Hospital Events: Including procedures, antibiotic start and stop dates in addition to other pertinent  events   11/11: Admitted to the progressive care unit but remained in the ER pending bed availability with sepsis suspected secondary to bacterial peritonitis  11/12: Pt developed hypotension requiring peripheral levophed gtt and transfer to ICU PCCM assumed care.  Abxs broadened to meropenum and vancomycin.  IR consulted for paracentesis, however no ascites visible on ultrasound to allow for safe approach for paracentesis   Interim History / Subjective:  Pt currently requiring levophed gtt @2mcg /min to maintain map >65  Objective    Blood pressure (!) 88/51, pulse 84, temperature 98.6 F (37 C), temperature source Oral, resp. rate 15, height 4' 9 (1.448 m), weight 70.3 kg, SpO2 99%.        Intake/Output Summary (Last 24 hours) at 05/07/2024 1248 Last data filed at 05/07/2024 1200 Gross per 24 hour  Intake 928.59 ml  Output --  Net 928.59 ml   Filed Weights   05/06/24 1442  Weight: 70.3 kg   Examination: General: Acutely-ill appearing female, NAD on RA   HENT: Supple, no JVD  Lungs: Clear throughout, even, non labored  Cardiovascular: NSR, s1s2, no m/r/g, 2+ radial/2+ distal pulses, no edema  Abdomen: +BS x4, obese, soft, non tender, non distended  Extremities: Normal bulk and tone, moves all extremities  Skin: LLQ Abdominal wound from PD catheter replacement present on admission (see image below)   Neuro: Alert and oriented, follows commands, PERRLA  GU: Deferred   Resolved problem list   Assessment and Plan   #Circulatory shock  Hx: Chronic hypotension, and atrial fibrillation  - Continuous telemetry monitoring  - Continue outpatient atorvastatin, midodrine  and fludrocortisone - Prn levophed gtt to maintain map 60 or higher and/or sbp 80 or higher  - Will check troponin   #ESRD on HD  #Hypokalemia  - Trend BMP  - Replace electrolytes as indicated  - Nephrology consulted appreciate input: HD per recommendations   #Concern for possible SBP   #Nausea/vomiting~resolved   - KUB negative  - IR unable to perform paracentesis no ascites visible on US  to allow for safe approach - Will r/o COVID/Influenza A&B/RSV   #Systemic Lupus Erythematosus  - Will hold outpatient hydroxychloroquine for now given concern for sepsis   #Anemia without signs of active bleeding  - Trend CBC  - Monitor for s/sx of bleeding  - Transfuse for hgb <7   #Hypoglycemia  - Will start diet  - CBG's q4hrs  - Follow hyper/hypoglycemic protocol  - Target CBG readings 140 to 180 while in ICU   Labs   CBC: Recent Labs  Lab 05/06/24 1509 05/07/24 0552  WBC 15.7* 18.1*  NEUTROABS 13.3*  --   HGB 10.9* 8.3*  HCT 35.7* 27.5*  MCV 102.3* 104.2*  PLT 347 255    Basic Metabolic Panel: Recent Labs  Lab 05/06/24 1509 05/07/24 0552  NA 141 141  K 3.7 3.3*  CL 97* 104  CO2 31 27  GLUCOSE 95 76  BUN 8 9  CREATININE 3.76* 4.75*  CALCIUM 9.3 8.4*  MG 1.7  --    GFR: Estimated Creatinine Clearance: 8.8 mL/min (A) (by C-G formula based on SCr of 4.75 mg/dL (H)). Recent Labs  Lab 05/06/24 1509 05/06/24 1731 05/06/24 1943 05/07/24 0552 05/07/24 0845  WBC 15.7*  --   --  18.1*  --   LATICACIDVEN  --  2.9* 3.5*  --  1.0    Liver Function Tests: Recent Labs  Lab 05/06/24 1509  AST 65*  ALT 20  ALKPHOS 84  BILITOT 0.4  PROT 7.2  ALBUMIN 4.0   No results for input(s): LIPASE, AMYLASE in the last 168 hours. No results for input(s): AMMONIA in the last 168 hours.  ABG No results found for: PHART, PCO2ART, PO2ART, HCO3, TCO2, ACIDBASEDEF, O2SAT   Coagulation Profile: Recent Labs  Lab 05/07/24 0552  INR 1.0    Cardiac Enzymes: No results for input(s): CKTOTAL, CKMB, CKMBINDEX, TROPONINI in the last 168 hours.  HbA1C: No results found for: HGBA1C  CBG: Recent Labs  Lab 05/06/24 1851 05/07/24 0828 05/07/24 0935 05/07/24 1210 05/07/24 1232  GLUCAP 168* 74 79 64* 76    Review of Systems:  Positives in BOLD   Gen: Denies fever, chills, weight change, fatigue, night sweats HEENT: Denies blurred vision, double vision, hearing loss, tinnitus, sinus congestion, rhinorrhea, sore throat, neck stiffness, dysphagia PULM: Denies shortness of breath, cough, sputum production, hemoptysis, wheezing CV: hypotension, chest pain, edema, orthopnea, paroxysmal nocturnal dyspnea, palpitations GI:  abdominal pain, nausea, vomiting, diarrhea, hematochezia, melena, constipation, change in bowel habits GU: Denies dysuria, hematuria, polyuria, oliguria, urethral discharge Endocrine: Denies hot or cold intolerance, polyuria, polyphagia or appetite change Derm: Denies rash, dry skin, scaling or peeling skin change Heme: Denies easy bruising, bleeding, bleeding gums Neuro: Denies headache, numbness, weakness, slurred speech, loss of memory or consciousness  Past Medical History:  She,  has no past medical history on file.   Surgical History:  History reviewed. No pertinent surgical history.   Social History:   reports that she has never smoked. She has never used smokeless tobacco. She reports that she does not drink  alcohol and does not use drugs.   Family History:  Her family history includes Breast cancer (age of onset: 12) in her mother.   Allergies Allergies  Allergen Reactions   Hydrochlorothiazide Hives   Lisinopril Hives    Angioedema   Spironolactone Other (See Comments)    Other Reaction(s): Breast Pain  Breast pain   Empagliflozin Dermatitis     Home Medications  Prior to Admission medications   Medication Sig Start Date End Date Taking? Authorizing Provider  acetaminophen (TYLENOL) 325 MG tablet Take 650 mg by mouth every 4 (four) hours as needed for mild pain (pain score 1-3). 04/21/24  Yes [provider]  atorvastatin (LIPITOR) 20 MG tablet Take 20 mg by mouth daily. 06/16/22 06/25/24 Yes [provider]  cinacalcet (SENSIPAR) 60 MG tablet Take 60 mg by  mouth daily. 12/13/23  Yes [provider]  epoetin alfa-epbx (RETACRIT) 10000 UNIT/ML injection Inject 10,000 Units into the skin 3 (three) times a week. Monday, Wednesday, Friday 04/23/24  Yes [provider]  fludrocortisone (FLORINEF) 0.1 MG tablet Take 0.1 mg by mouth daily. 03/28/24 03/28/25 Yes [provider]  fluticasone furoate-vilanterol (BREO ELLIPTA) 100-25 MCG/ACT AEPB Inhale 1 puff into the lungs daily. 03/27/24  Yes [provider]  gabapentin (NEURONTIN) 300 MG capsule Take 300 mg by mouth 2 (two) times daily. 10/24/22  Yes [provider]  hydroxychloroquine (PLAQUENIL) 200 MG tablet Take 200 mg by mouth daily.   Yes [provider]  lidocaine (LIDODERM) 5 % Place 1 patch onto the skin daily. Remove & Discard patch within 12 hours or as directed by MD   Yes [provider]  midodrine (PROAMATINE) 10 MG tablet Take 20 mg by mouth 3 (three) times daily. 03/27/24 07/25/24 Yes [provider]  ondansetron (ZOFRAN-ODT) 4 MG disintegrating tablet Take 4 mg by mouth every 8 (eight) hours as needed for nausea or vomiting.   Yes [provider]     Critical care time: 60 minutes      Lonell Moose, AGNP  Pulmonary/Critical Care Pager [phone] (please enter 7 digits) PCCM Consult Pager [phone] (please enter 7 digits)

## 2024-05-07 NOTE — Progress Notes (Signed)
 Hypoglycemic Event  CBG: 64 at 12:10  Treatment: 4 ounces of orange juice   Symptoms: None  Follow-up CBG: Time:12:32 CBG Result:76  Possible Reasons for Event: Unknown      Cynthia Harvey

## 2024-05-07 NOTE — ED Notes (Signed)
 Patient changed into gown per request. Pt changed into clean brief at this time.

## 2024-05-07 NOTE — Plan of Care (Signed)
   Problem: Fluid Volume: Goal: Hemodynamic stability will improve Outcome: Progressing   Problem: Clinical Measurements: Goal: Diagnostic test results will improve Outcome: Progressing Goal: Signs and symptoms of infection will decrease Outcome: Progressing   Problem: Respiratory: Goal: Ability to maintain adequate ventilation will improve Outcome: Progressing

## 2024-05-07 NOTE — Progress Notes (Signed)
 Pt receives outpt HD at [facility] on TTS at 7:10am. Navigator following to assist with any HD needs.  Suzen Satchel Dialysis Navigator [phone]

## 2024-05-07 NOTE — ED Notes (Addendum)
 Delay in tranferring pt to ICU, waiting on Levo to be verified, per Cynthia Harvey levo must be started before pt comes to ICU

## 2024-05-07 NOTE — ED Notes (Addendum)
 Dr Cleatus made aware of pts BP of 84/45 at this time

## 2024-05-07 NOTE — Progress Notes (Signed)
Zofran 4mg IV given for nausea.

## 2024-05-07 NOTE — Consult Note (Addendum)
 Pharmacy Antibiotic Note  Brynda Heick Delatorre is a 73 y.o. female admitted on 05/06/2024 following BP drop during dialysis and concerns for sepsis 2/2 bacterial peritonitis / PD catheter-associated infection. Past medical history notable for SLE and recent pAfib (not on AC) and PD-associated Pseudomonas peritonitis(meropenem-> Cipro, completed 04/28/2024) during 10/11-10/27 [facility] hospitalization. Pharmacy has been consulted for vancomycin dosing.  Concerns possible source is patient's port necessitating empiric gram positive coverage. WBC 15.1 on admission and uptrending (received 100mcg fludrocortisone), lactate 2.9 on admission (repeat 3.9) now 1.0, and afebrile.   In terms of renal function, patient recently transitioned from PD to HD last month. Dialysis days TTS.  Plan: Ordered loading dose vancomycin 1500mg  IV Plan for post-HD 750 IV  Height: 4' 9 (144.8 cm) Weight: 70.3 kg (155 lb) IBW/kg (Calculated) : 38.6  Temp (24hrs), Avg:98.3 F (36.8 C), Min:97.7 F (36.5 C), Max:98.9 F (37.2 C)  Recent Labs  Lab 05/06/24 1509 05/06/24 1731 05/06/24 1943 05/07/24 0552 05/07/24 0845  WBC 15.7*  --   --  18.1*  --   CREATININE 3.76*  --   --  4.75*  --   LATICACIDVEN  --  2.9* 3.5*  --  1.0    Estimated Creatinine Clearance: 8.8 mL/min (A) (by C-G formula based on SCr of 4.75 mg/dL (H)).    Allergies  Allergen Reactions   Hydrochlorothiazide Hives   Lisinopril Hives    Angioedema   Spironolactone Other (See Comments)    Other Reaction(s): Breast Pain  Breast pain   Empagliflozin Dermatitis    Antimicrobials this admission: Ceftriaxone x1 Flaygyl IV x1  Dose adjustments this admission:   Microbiology results: 11/11 BCx: ngtd 11/12 MRSA PCR: negative  Thank you for allowing pharmacy to be a part of this patient's care.  Leonor BROCKS Mariavictoria Nottingham 05/07/2024 11:35 AM

## 2024-05-07 NOTE — Progress Notes (Signed)
 Patient accepted from ED to ICU room 11.  Assessment complete, see flowsheets.  VSS.  Patient alert and oriented in no distress.  Oriented patient to room and placed call bell within reach.  Nurse will continue to monitor.

## 2024-05-07 NOTE — ED Notes (Signed)
 Dr Marsa raker to notify of current bp 73/43

## 2024-05-07 NOTE — Progress Notes (Signed)
 PROGRESS NOTE    Cynthia Harvey   FMW:[id number] DOB: [birth year]  DOA: 05/06/2024 Date of Service: 05/07/24 which is hospital day 0  PCP: Henry Fitch, MD    Hospital course / significant events:   HPI:  Cynthia Harvey is a 73 y.o. female with medical history significant for SLE, complicated by ESRD recently transitioned from PD to HD during a hospitalization at [facility] (10/11 to 04/21/2024) for PD associated Pseudomonas peritonitis(meropenem-> Cipro, completed 04/28/2024), brief paroxysmal A-fib during that hospitalization (not placed on [facility]), chronic hypotension on midodrine and Florinef presenting w/ chief complaint hypotension, sent by dialysis center.   during her recent hospitalization she had recurring problems with hypotension/hypoglycemia s/p cortisol stimulation test with normal cortisol values.   11/11: admitted to hospitalist service with concern for sepsis, suspect from SBP  11/12: BP continuing to drop, transfer to ICU service for pressors    TR[facility] will be happy to resume care if/when pt stable to return to hospitalist service                Data reviewed:  Vitals:   05/07/24 1300 05/07/24 1330 05/07/24 1400 05/07/24 1430  BP: (!) 108/54 (!) 95/49 (!) 88/50 (!) 91/50  Pulse: 76 76 69 84  Resp: 17 13 16 15   Temp:      TempSrc:      SpO2: 98%     Weight:      Height:        Intake/Output Summary (Last 24 hours) at 05/07/2024 1522 Last data filed at 05/07/2024 1400 Gross per 24 hour  Intake 1273.67 ml  Output --  Net 1273.67 ml   Filed Weights   05/06/24 1442  Weight: 70.3 kg   CBC: Recent Labs  Lab 05/06/24 1509 05/07/24 0552  WBC 15.7* 18.1*  NEUTROABS 13.3*  --   HGB 10.9* 8.3*  HCT 35.7* 27.5*  MCV 102.3* 104.2*  PLT 347 255   Basic Metabolic Panel: Recent Labs  Lab 05/06/24 1509 05/07/24 0552 05/07/24 1418  NA 141 141  --   K 3.7 3.3*  --   CL 97* 104  --   CO2 31 27  --   GLUCOSE 95 76  --   BUN 8 9  --    CREATININE 3.76* 4.75*  --   CALCIUM 9.3 8.4*  --   MG 1.7  --  1.8  PHOS  --   --  2.0*   GFR: Estimated Creatinine Clearance: 8.8 mL/min (A) (by C-G formula based on SCr of 4.75 mg/dL (H)). Liver Function Tests: Recent Labs  Lab 05/06/24 1509 05/07/24 1418  AST 65* 45*  ALT 20 19  ALKPHOS 84 70  BILITOT 0.4 0.3  PROT 7.2 5.4*  ALBUMIN 4.0 2.9*   No results for input(s): LIPASE, AMYLASE in the last 168 hours. No results for input(s): AMMONIA in the last 168 hours. Coagulation Profile: Recent Labs  Lab 05/07/24 0552  INR 1.0   Cardiac Enzymes: No results for input(s): CKTOTAL, CKMB, CKMBINDEX, TROPONINI in the last 168 hours. BNP (last 3 results) No results for input(s): PROBNP in the last 8760 hours. HbA1C: No results for input(s): HGBA1C in the last 72 hours. CBG: Recent Labs  Lab 05/07/24 0828 05/07/24 0935 05/07/24 1210 05/07/24 1232 05/07/24 1400  GLUCAP 74 79 64* 76 82   Lipid Profile: No results for input(s): CHOL, HDL, LDLCALC, TRIG, CHOLHDL, LDLDIRECT in the last 72 hours. Thyroid  Function Tests: No results for input(s): TSH, T4TOTAL,  FREET4, T3FREE, THYROIDAB in the last 72 hours. Anemia Panel: No results for input(s): VITAMINB12, FOLATE, FERRITIN, TIBC, IRON, RETICCTPCT in the last 72 hours. Most Recent Urinalysis On File:  No results found for: COLORURINE, APPEARANCEUR, LABSPEC, PHURINE, GLUCOSEU, HGBUR, BILIRUBINUR, KETONESUR, PROTEINUR, UROBILINOGEN, NITRITE, LEUKOCYTESUR Sepsis Labs: @LABRCNTIP (procalcitonin:4,lacticidven:4) Microbiology: Recent Results (from the past 240 hours)  Blood culture (single)     Status: None (Preliminary result)   Collection Time: 05/06/24 11:05 PM   Specimen: BLOOD  Result Value Ref Range Status   Specimen Description BLOOD BLOOD RIGHT HAND  Final   Special Requests   Final    BOTTLES DRAWN AEROBIC AND ANAEROBIC Blood Culture adequate  volume   Culture   Final    NO GROWTH < 12 HOURS Performed at [facility], [address] [place], [place] [address]    Report Status PENDING  Incomplete  MRSA Next Gen by PCR, Nasal     Status: None   Collection Time: 05/07/24  9:37 AM   Specimen: Nasal Mucosa; Nasal Swab  Result Value Ref Range Status   MRSA by PCR Next Gen NOT DETECTED NOT DETECTED Final    Comment: (NOTE) The GeneXpert MRSA Assay (FDA approved for NASAL specimens only), is one component of a comprehensive MRSA colonization surveillance program. It is not intended to diagnose MRSA infection nor to guide or monitor treatment for MRSA infections. Test performance is not FDA approved in patients less than 52 years old. Performed at [facility], [address] [place], [place] [address]       Radiology Studies last 3 days: US  Abdomen Limited Result Date: 05/07/2024 CLINICAL DATA:  73 year old female currently admitted with sepsis thought to be related to possible bacterial peritonitis. Request for diagnostic paracentesis. EXAM: ULTRASOUND ABDOMEN LIMITED COMPARISON:  None FINDINGS: Limited ultrasound done of all 4 quadrants. There is no ascites present. There is no pocket of fluid large enough to allow for safe approach for paracentesis. IMPRESSION: No ascites visible on ultrasound to allow for safe approach for paracentesis. Ultrasound performed by: Sherrilee Bal, PA-C Electronically Signed   By: Cordella Banner   On: 05/07/2024 13:17   DG Abd 1 View Result Date: 05/07/2024 CLINICAL DATA:  Nausea vomiting. EXAM: ABDOMEN - 1 VIEW COMPARISON:  None Available. FINDINGS: No gaseous small bowel or colonic dilatation. No unexpected abdominopelvic calcification. Chronic changes noted at the symphysis pubis. IMPRESSION: Nonobstructive bowel gas pattern. Electronically Signed   By: Camellia Candle M.D.   On: 05/07/2024 10:44   DG Chest Port 1 View Result Date: 05/06/2024 CLINICAL DATA:  Hypotension.  EXAM: PORTABLE CHEST 1 VIEW COMPARISON:  None Available. FINDINGS: Right IJ central venous catheter has tip over the SVC just above the cavoatrial junction. Lungs are hypoinflated and otherwise clear. Cardiomediastinal silhouette is normal. Mild prominent overlying soft tissues. Bony structures are unremarkable. IMPRESSION: Hypoinflation without acute cardiopulmonary disease. Electronically Signed   By: Toribio Agreste M.D.   On: 05/06/2024 18:37     Laneta Blunt, DO [facility] 05/07/2024, 3:22 PM    No charge note

## 2024-05-07 NOTE — Progress Notes (Signed)
 OT Cancellation Note  Patient Details Name: Cynthia Harvey MRN: [id number] DOB: 1950-07-29   Cancelled Treatment:    Reason Eval/Treat Not Completed: Medical issues which prohibited therapy. Order received, chart reviewed. Pt noted to have MAP 63, discussed with RN and plan to hold this date. Will re-attempt next date to initiate services as pt appropriate.   Elston Slot, M.S. OTR/L  05/07/24, 1:30 PM  ascom [phone]

## 2024-05-07 NOTE — Consult Note (Signed)
 [facility]  CONSULT NOTE    Date: 05/07/2024                  Patient Name:  Cynthia Harvey  MRN: [id number]  DOB: 09/21/1950  Age / Sex: 73 y.o., female         PCP: Henry Fitch, MD                 Service Requesting Consult: [facility]                 Reason for Consult: End stage renal disease on hemodialysis            History of Present Illness: Cynthia Harvey is a 73 y.o.  female with past medical history of SLE, pAfib, chronic hypotension and end stage renal disease on hemodialysis, who was admitted to [facility] on 05/06/2024 for Hypoglycemia [E16.2] Hypotension [I95.9] Sepsis (HCC) [A41.9] Hypotension, unspecified hypotension type [I95.9]  Patient is known to our practice and receives outpatient dialysis treatments at [facility] on a TTS schedule, supervised by [facility] physicians. Last treatment completed on Tuesday. She is currently at [facility] who states her blood pressure and glucose dropped. She said she began to have vomiting and was unable to tolerate oral intake.  She is currently seen and evaluated at bedside in ICU.  Completely alert and oriented.  Room air, denies shortness of breath.  Denies current nausea.  Pressor support with Levophed in place.  Glucose was 54 at facility. Was given orange juice and vomited.  Other serologies include white count 15.7 with hemoglobin 10.9, lactic acid 2.9.  Chest x-ray negative for acute changes.  Abdominal ultrasound negative for ascites   Medications: Outpatient medications: Medications Prior to Admission  Medication Sig Dispense Refill Last Dose/Taking   acetaminophen (TYLENOL) 325 MG tablet Take 650 mg by mouth every 4 (four) hours as needed for mild pain (pain score 1-3).   04/29/2024   atorvastatin (LIPITOR) 20 MG tablet Take 20 mg by mouth daily.   05/05/2024 at  8:39 AM   cinacalcet (SENSIPAR) 60 MG tablet Take 60 mg by mouth daily.   05/05/2024 at  8:39 AM   epoetin alfa-epbx  (RETACRIT) 10000 UNIT/ML injection Inject 10,000 Units into the skin 3 (three) times a week. Monday, Wednesday, Friday   04/30/2024   fludrocortisone (FLORINEF) 0.1 MG tablet Take 0.1 mg by mouth daily.   05/05/2024 at  8:39 AM   fluticasone furoate-vilanterol (BREO ELLIPTA) 100-25 MCG/ACT AEPB Inhale 1 puff into the lungs daily.   05/05/2024 at  8:39 AM   gabapentin (NEURONTIN) 300 MG capsule Take 300 mg by mouth 2 (two) times daily.   05/05/2024 at  6:00 PM   hydroxychloroquine (PLAQUENIL) 200 MG tablet Take 200 mg by mouth daily.   05/05/2024 at  8:39 AM   lidocaine (LIDODERM) 5 % Place 1 patch onto the skin daily. Remove & Discard patch within 12 hours or as directed by MD   05/05/2024 at  8:39 AM   midodrine (PROAMATINE) 10 MG tablet Take 20 mg by mouth 3 (three) times daily.   05/06/2024 at  1:01 PM   ondansetron (ZOFRAN-ODT) 4 MG disintegrating tablet Take 4 mg by mouth every 8 (eight) hours as needed for nausea or vomiting.   05/03/2024    Current medications: Current Facility-Administered Medications  Medication Dose Route Frequency Provider Last Rate Last Admin   0.9 %  sodium chloride infusion  250 mL Intravenous  Continuous Nelson, Dana G, NP       acetaminophen (TYLENOL) tablet 650 mg  650 mg Oral Q6H PRN Duncan, Hazel V, MD       Or   acetaminophen (TYLENOL) suppository 650 mg  650 mg Rectal Q6H PRN Duncan, Hazel V, MD       atorvastatin (LIPITOR) tablet 20 mg  20 mg Oral Daily Duncan, Hazel V, MD   20 mg at 05/07/24 0841   Chlorhexidine Gluconate Cloth 2 % PADS 6 each  6 each Topical Q0600 Isadora Hose, MD   6 each at 05/07/24 1011   cinacalcet (SENSIPAR) tablet 60 mg  60 mg Oral Q breakfast Duncan, Hazel V, MD       dextrose 5 %-0.9 % sodium chloride infusion   Intravenous Continuous Cleatus Delayne GAILS, MD 40 mL/hr at 05/07/24 1200 Infusion Verify at 05/07/24 1200   fludrocortisone (FLORINEF) tablet 0.1 mg  0.1 mg Oral Daily Siadecki, Sebastian, MD   0.1 mg at 05/06/24 1735    fluticasone furoate-vilanterol (BREO ELLIPTA) 100-25 MCG/ACT 1 puff  1 puff Inhalation Daily Cleatus Delayne GAILS, MD       gabapentin (NEURONTIN) capsule 300 mg  300 mg Oral BID Duncan, Hazel V, MD   300 mg at 05/07/24 0841   heparin injection 5,000 Units  5,000 Units Subcutaneous Q8H Duncan, Hazel V, MD   5,000 Units at 05/07/24 0547   HYDROcodone-acetaminophen (NORCO/VICODIN) 5-325 MG per tablet 1-2 tablet  1-2 tablet Oral Q4H PRN Duncan, Hazel V, MD   2 tablet at 05/07/24 1126   hydroxychloroquine (PLAQUENIL) tablet 200 mg  200 mg Oral Daily Duncan, Hazel V, MD       meropenem (MERREM) 500 mg in sodium chloride 0.9 % 100 mL IVPB  500 mg Intravenous QHS Chappell, Alex B, RPH       midodrine (PROAMATINE) tablet 20 mg  20 mg Oral TID WC Duncan, Hazel V, MD   20 mg at 05/07/24 1215   norepinephrine (LEVOPHED) 4mg  in (0.016 mg/mL) premix infusion  0-10 mcg/min Intravenous Titrated Nelson, Dana G, NP 7.5 mL/hr at 05/07/24 1200 2 mcg/min at 05/07/24 1200   ondansetron (ZOFRAN) tablet 4 mg  4 mg Oral Q6H PRN Duncan, Hazel V, MD       Or   ondansetron (ZOFRAN) injection 4 mg  4 mg Intravenous Q6H PRN Duncan, Hazel V, MD       vancomycin (VANCOREADY) IVPB 1500 mg/300 mL  1,500 mg Intravenous Once Viviana Leonor BROCKS, [place] 150 mL/hr at 05/07/24 1219 1,500 mg at 05/07/24 1219      Allergies: Allergies  Allergen Reactions   Hydrochlorothiazide Hives   Lisinopril Hives    Angioedema   Spironolactone Other (See Comments)    Other Reaction(s): Breast Pain  Breast pain   Empagliflozin Dermatitis      Past Medical History: History reviewed. No pertinent past medical history.   Past Surgical History: History reviewed. No pertinent surgical history.   Family History: Family History  Problem Relation Age of Onset   Breast cancer Mother 55     Social History: Social History   Socioeconomic History   Marital status: Single    Spouse name: Not on file   Number of children: Not on file    Years of education: Not on file   Highest education level: Not on file  Occupational History   Not on file  Tobacco Use   Smoking status: Never   Smokeless tobacco: Never  Vaping  Use   Vaping status: Never Used  Substance and Sexual Activity   Alcohol use: Never   Drug use: Never   Sexual activity: Not on file  Other Topics Concern   Not on file  Social History Narrative   Not on file   Social Drivers of Health   Financial Resource Strain: Low Risk (03/14/2024)   Received [facility]   Overall Financial Resource Strain (CARDIA)    How hard is it for you to pay for the very basics like food, housing, medical care, and heating?: Not very hard  Food Insecurity: No Food Insecurity (05/07/2024)   Hunger Vital Sign    Worried About Running Out of Food in the Last Year: Never true    Ran Out of Food in the Last Year: Never true  Transportation Needs: No Transportation Needs (05/07/2024)   PRAPARE - Administrator, Civil Service (Medical): No    Lack of Transportation (Non-Medical): No  Physical Activity: Unknown (06/01/2023)   Received from [facility]   Exercise Vital Sign    On average, how many days per week do you engage in moderate to strenuous exercise (like a brisk walk)?: 0 days    Minutes of Exercise per Session: Not on file  Stress: No Stress Concern Present (06/01/2023)   Received from [facility] Occupational [facility] - Occupational Stress Questionnaire    Feeling of Stress : Not at all  Social Connections: Unknown (05/07/2024)   Social Connection and Isolation Panel    Frequency of Communication with Friends and Family: More than three times a week    Frequency of Social Gatherings with Friends and Family: Twice a week    Attends Religious Services: More than 4 times per year    Active Member of [employer] or Organizations: Yes    Attends Banker Meetings: 1 to 4 times per year    Marital Status: Patient declined   Intimate Partner Violence: Not At Risk (05/07/2024)   Humiliation, Afraid, Rape, and Kick questionnaire    Fear of Current or Ex-Partner: No    Emotionally Abused: No    Physically Abused: No    Sexually Abused: No     Review of Systems: Review of Systems  Constitutional:  Negative for chills, fever and malaise/fatigue.  HENT:  Negative for congestion, sore throat and tinnitus.   Eyes:  Negative for blurred vision and redness.  Respiratory:  Negative for cough, shortness of breath and wheezing.   Cardiovascular:  Negative for chest pain, palpitations, claudication and leg swelling.  Gastrointestinal:  Positive for vomiting. Negative for abdominal pain, blood in stool, diarrhea and nausea.  Genitourinary:  Negative for flank pain, frequency and hematuria.  Musculoskeletal:  Negative for back pain, falls and myalgias.  Skin:  Negative for rash.  Neurological:  Negative for dizziness, weakness and headaches.  Endo/Heme/Allergies:  Does not bruise/bleed easily.  Psychiatric/Behavioral:  Negative for depression. The patient is not nervous/anxious and does not have insomnia.     Vital Signs: Blood pressure (!) 88/51, pulse 84, temperature 98.6 F (37 C), temperature source Oral, resp. rate 15, height 4' 9 (1.448 m), weight 70.3 kg, SpO2 99%.  Weight trends: Filed Weights   05/06/24 1442  Weight: 70.3 kg    Physical Exam: General: NAD,   Head: Normocephalic, atraumatic. Moist oral mucosal membranes  Eyes: Anicteric  Lungs:  Clear to auscultation, normal effort  Heart: Regular rate and rhythm  Abdomen:  Soft, nontender  Extremities: No peripheral edema.  Neurologic: Alert and oriented  Skin: No lesions  Access: Right IJ PermCath     Lab results: Basic Metabolic Panel: Recent Labs  Lab 05/06/24 1509 05/07/24 0552  NA 141 141  K 3.7 3.3*  CL 97* 104  CO2 31 27  GLUCOSE 95 76  BUN 8 9  CREATININE 3.76* 4.75*  CALCIUM 9.3 8.4*  MG 1.7  --     Liver Function  Tests: Recent Labs  Lab 05/06/24 1509  AST 65*  ALT 20  ALKPHOS 84  BILITOT 0.4  PROT 7.2  ALBUMIN 4.0   No results for input(s): LIPASE, AMYLASE in the last 168 hours. No results for input(s): AMMONIA in the last 168 hours.  CBC: Recent Labs  Lab 05/06/24 1509 05/07/24 0552  WBC 15.7* 18.1*  NEUTROABS 13.3*  --   HGB 10.9* 8.3*  HCT 35.7* 27.5*  MCV 102.3* 104.2*  PLT 347 255    Cardiac Enzymes: No results for input(s): CKTOTAL, CKMB, CKMBINDEX, TROPONINI in the last 168 hours.  BNP: Invalid input(s): POCBNP  CBG: Recent Labs  Lab 05/06/24 1851 05/07/24 0828 05/07/24 0935 05/07/24 1210 05/07/24 1232  GLUCAP 168* 74 79 64* 76    Microbiology: Results for orders placed or performed during the hospital encounter of 05/06/24  Blood culture (single)     Status: None (Preliminary result)   Collection Time: 05/06/24 11:05 PM   Specimen: BLOOD  Result Value Ref Range Status   Specimen Description BLOOD BLOOD RIGHT HAND  Final   Special Requests   Final    BOTTLES DRAWN AEROBIC AND ANAEROBIC Blood Culture adequate volume   Culture   Final    NO GROWTH < 12 HOURS Performed at [facility], [address] [place], [place] [address]    Report Status PENDING  Incomplete  MRSA Next Gen by PCR, Nasal     Status: None   Collection Time: 05/07/24  9:37 AM   Specimen: Nasal Mucosa; Nasal Swab  Result Value Ref Range Status   MRSA by PCR Next Gen NOT DETECTED NOT DETECTED Final    Comment: (NOTE) The GeneXpert MRSA Assay (FDA approved for NASAL specimens only), is one component of a comprehensive MRSA colonization surveillance program. It is not intended to diagnose MRSA infection nor to guide or monitor treatment for MRSA infections. Test performance is not FDA approved in patients less than 22 years old. Performed at [facility], [address] [place], [place] [address]     Coagulation Studies: Recent Labs     05/07/24 0552  LABPROT 13.3  INR 1.0    Urinalysis: No results for input(s): COLORURINE, LABSPEC, PHURINE, GLUCOSEU, HGBUR, BILIRUBINUR, KETONESUR, PROTEINUR, UROBILINOGEN, NITRITE, LEUKOCYTESUR in the last 72 hours.  Invalid input(s): APPERANCEUR    Imaging: US  Abdomen Limited Result Date: 05/07/2024 CLINICAL DATA:  72 year old female currently admitted with sepsis thought to be related to possible bacterial peritonitis. Request for diagnostic paracentesis. EXAM: ULTRASOUND ABDOMEN LIMITED COMPARISON:  None FINDINGS: Limited ultrasound done of all 4 quadrants. There is no ascites present. There is no pocket of fluid large enough to allow for safe approach for paracentesis. IMPRESSION: No ascites visible on ultrasound to allow for safe approach for paracentesis. Ultrasound performed by: Sherrilee Bal, PA-C Electronically Signed   By: Cordella Banner   On: 05/07/2024 13:17   DG Abd 1 View Result Date: 05/07/2024 CLINICAL DATA:  Nausea vomiting. EXAM: ABDOMEN - 1 VIEW COMPARISON:  None Available. FINDINGS: No gaseous small bowel or colonic dilatation. No unexpected abdominopelvic calcification. Chronic changes noted at the symphysis pubis. IMPRESSION: Nonobstructive bowel gas pattern. Electronically Signed   By: Camellia Candle M.D.   On: 05/07/2024 10:44   DG Chest Port 1 View Result Date: 05/06/2024 CLINICAL DATA:  Hypotension. EXAM: PORTABLE CHEST 1 VIEW COMPARISON:  None Available. FINDINGS: Right IJ central venous catheter has tip over the SVC just above the cavoatrial junction. Lungs are hypoinflated and otherwise clear. Cardiomediastinal silhouette is normal. Mild prominent overlying soft tissues. Bony structures are unremarkable. IMPRESSION: Hypoinflation without acute cardiopulmonary disease. Electronically Signed   By: Toribio Agreste M.D.   On: 05/06/2024 18:37     Assessment & Plan: Cynthia Harvey is a 73 y.o.  female with past medical history of  SLE, pAfib, chronic hypotension and end stage renal disease on hemodialysis, who was admitted to [facility] on 05/06/2024 for Hypoglycemia [E16.2] Hypotension [I95.9] Sepsis (HCC) [A41.9] Hypotension, unspecified hypotension type [I95.9]  End-stage renal disease on hemodialysis.  Last treatment completed on Tuesday.  No urgent need for dialysis today.  Will schedule treatment for Thursday.  2. Anemia of chronic kidney disease Lab Results  Component Value Date   HGB 8.3 (L) 05/07/2024    Hemoglobin slightly decreased.  Patient receives Mircera at outpatient clinic.  Will order low-dose Retacrit with dialysis.  3. Secondary Hyperparathyroidism: with outpatient labs: PTH 366, phosphorus 4.5, calcium 8.6 on 11//25.    Lab Results  Component Value Date   CALCIUM 8.4 (L) 05/07/2024   PHOS 3.8 03/13/2024    Patient prescribed calcitriol, cholecalciferol, Sensipar, and sevelamer outpatient.Sensipar has been restarted.   4. Hypotension likely secondary to sepsis. Suspected bacterial peritonitis . Recent PD catheter removal. Elevated white count and lactic acid. Primary team has ordered meropenem and vancomycin.   LOS: 0 Levoy Geisen 11/12/20251:55 PM

## 2024-05-07 NOTE — Hospital Course (Addendum)
 Hospital course / significant events:   HPI:  Cynthia Harvey is a 73 y.o. female with medical history significant for SLE on hydroxychloroquine, complicated by ESRD recently transitioned from PD to HD during a hospitalization at [facility] (10/11 to 04/21/2024) for PD associated Pseudomonas peritonitis(meropenem-> Cipro, completed 04/28/2024), brief paroxysmal A-fib during that hospitalization (not placed on [facility]), chronic hypotension on midodrine and Florinef presenting w/ chief complaint hypotension, sent by dialysis center.   during her recent hospitalization she had recurring problems with hypotension/hypoglycemia s/p cortisol stimulation test with normal cortisol values.   11/11: admitted to hospitalist service with concern for sepsis, suspect from SBP  11/12: BP continuing to drop, transfer to ICU service for pressors. IR attempted paracentesis but no ascites visible for sampling. Crotisol level was 13.3, and this might represent mild functional adrenal insufficiency, so hydrocortisone was started.  11/13: holding off dialysis d/t low BP, remains on levo --> off later in the day. Pt c/o nausea CT Abd/Pelvis non concerning   11/14: midodrine 20 mg 3 times daily, dialysis treatment today with no UF, Albumin 25 g IV given.  No longer on vasopressors.  MAP goal reduced to 60.  SBP goal reduced to 80.  Nausea improved. 11/15: overnight hypotensive again repeat lactic acid showing some poor perfusion with an increased value of 2.9. and back to ICU on levophed. Art line inserted for close BP monitoring. It is unclear what the source of the patient's continued hypotension is, cultures remaining negative and broad spectrum antibiotics day 4 today. Off pressors again in afternoon - Arterial line revealed cuff pressures are NOT accurate pt NOT hypotensive so vasopressors stopped.  Echo pending 11/16: remain off pressors. Dialysis today. Plan transfer to hospitalist tomorrow. Echo LVEF 55-60 no RWMA, normal  diastolic, no significant valve abn 11/17: hospitalist assumes care. Art line out. Weaning down on IV steroids 11/18: stopping IV steroids this evening (last dose this AM), if BP holding anticipate medically stable tomorrow, hopefully can go back to [facility]. Will finish 7 days abx tomorrow        Consultants:  PCCU Nephrology  Interventional Radiology   Procedures/Surgeries: 11/15: art line R radial 11/15: CVC line R femoral       ASSESSMENT & PLAN:   #Circulatory shock~resolved   #Possible adrenal insufficiency  Hx: Chronic hypotension and atrial fibrillation  telemetry monitoring  Midodrine Art line in place for close BP monitoring / accurate measurement  stress dose steroids iv 100 mg q8hrs --> weaned off to q12h --> dc today  Continue po Florinef   #ESRD on HD  #Hyponatremia  #Hypomagnesia  Trend BMP  Replace electrolytes as indicated  Nephrology consulted appreciate input: HD per recommendations    #Possible peritonitis  #Nausea/vomiting~resolved   Trend WBC and monitor fever curve Repeat hepatic function panel   KUB negative  IR unable to perform paracentesis no ascites visible on US  to allow for safe approach COVID/Influenza A&B/RSV: negative    #Systemic Lupus Erythematosus  hold outpatient hydroxychloroquine for now given concern for sepsis    #Anemia without signs of active bleeding  trend CBC  Monitor for s/sx of bleeding  Transfuse for hgb <7    #Hypoglycemia  Continue diet  CBG's q4hrs --> as needed  Follow hyper/hypoglycemic protocol     Class 1 obesity based on BMI: Body mass index is 33.49 kg/m.[name] Significantly low or high BMI is associated with higher medical risk.  Underweight - under 18  overweight - 25 to 29 obese -  30 or more Class 1 obesity: BMI of 30.0 to 34 Class 2 obesity: BMI of 35.0 to 39 Class 3 obesity: BMI of 40.0 to 49 Super Morbid Obesity: BMI 50-59 Super-super Morbid Obesity: BMI 60+ Healthy nutrition and physical  activity advised as adjunct to other disease management and risk reduction treatments    DVT prophylaxis: heparin IV fluids: no continuous IV fluids  Nutrition: regular w/ fluid restriction Central lines / other devices: art line removed today, CVC in place as this is only access  Code Status: FULL DOE ACP documentation reviewed:  none on file in [facility]  TOC needs: TBD Medical barriers to dispo: weaning IV steroids. Expected medical readiness for discharge next couple days.

## 2024-05-08 ENCOUNTER — Inpatient Hospital Stay

## 2024-05-08 DIAGNOSIS — Z992 Dependence on renal dialysis: Secondary | ICD-10-CM | POA: Diagnosis not present

## 2024-05-08 DIAGNOSIS — R6521 Severe sepsis with septic shock: Secondary | ICD-10-CM | POA: Diagnosis not present

## 2024-05-08 DIAGNOSIS — N186 End stage renal disease: Secondary | ICD-10-CM | POA: Diagnosis not present

## 2024-05-08 DIAGNOSIS — A419 Sepsis, unspecified organism: Secondary | ICD-10-CM | POA: Diagnosis not present

## 2024-05-08 LAB — CBC
HCT: 31.2 % — ABNORMAL LOW (ref 36.0–46.0)
Hemoglobin: 9.5 g/dL — ABNORMAL LOW (ref 12.0–15.0)
MCH: 31.8 pg (ref 26.0–34.0)
MCHC: 30.4 g/dL (ref 30.0–36.0)
MCV: 104.3 fL — ABNORMAL HIGH (ref 80.0–100.0)
Platelets: 279 K/uL (ref 150–400)
RBC: 2.99 MIL/uL — ABNORMAL LOW (ref 3.87–5.11)
RDW: 17.6 % — ABNORMAL HIGH (ref 11.5–15.5)
WBC: 15.3 K/uL — ABNORMAL HIGH (ref 4.0–10.5)
nRBC: 0.3 % — ABNORMAL HIGH (ref 0.0–0.2)

## 2024-05-08 LAB — BASIC METABOLIC PANEL WITH GFR
Anion gap: 13 (ref 5–15)
BUN: 13 mg/dL (ref 8–23)
CO2: 23 mmol/L (ref 22–32)
Calcium: 8.9 mg/dL (ref 8.9–10.3)
Chloride: 100 mmol/L (ref 98–111)
Creatinine, Ser: 5.64 mg/dL — ABNORMAL HIGH (ref 0.44–1.00)
GFR, Estimated: 8 mL/min — ABNORMAL LOW (ref >60)
Glucose, Bld: 92 mg/dL (ref 70–99)
Potassium: 3.4 mmol/L — ABNORMAL LOW (ref 3.5–5.1)
Sodium: 136 mmol/L (ref 135–145)

## 2024-05-08 LAB — GLUCOSE, CAPILLARY
Glucose-Capillary: 77 mg/dL (ref 70–99)
Glucose-Capillary: 79 mg/dL (ref 70–99)
Glucose-Capillary: 71 mg/dL (ref 70–99)
Glucose-Capillary: 112 mg/dL — ABNORMAL HIGH (ref 70–99)
Glucose-Capillary: 58 mg/dL — ABNORMAL LOW (ref 70–99)
Glucose-Capillary: 111 mg/dL — ABNORMAL HIGH (ref 70–99)
Glucose-Capillary: 90 mg/dL (ref 70–99)

## 2024-05-08 LAB — TROPONIN T, HIGH SENSITIVITY: Troponin T High Sensitivity: 109 ng/L (ref 0–19)

## 2024-05-08 LAB — PHOSPHORUS: Phosphorus: 3.2 mg/dL (ref 2.5–4.6)

## 2024-05-08 MED ORDER — PANTOPRAZOLE SODIUM 40 MG PO TBEC: 40.0000 mg | DELAYED_RELEASE_TABLET | Freq: Every day | ORAL | Status: DC

## 2024-05-08 MED ORDER — VANCOMYCIN HCL 750 MG/150ML IV SOLN
750.0000 mg | INTRAVENOUS | Status: DC
  Filled 2024-05-08: qty 150

## 2024-05-08 MED ORDER — NITROGLYCERIN 2 % TD OINT
1.0000 [in_us] | TOPICAL_OINTMENT | Freq: Three times a day (TID) | TRANSDERMAL | Status: DC
  Filled 2024-05-08: qty 1

## 2024-05-08 MED ORDER — NOREPINEPHRINE 4 MG/250ML-% IV SOLN
0.0000 ug/min | INTRAVENOUS | Status: DC
Start: 2024-05-08 — End: 2024-05-09
  Administered 2024-05-08: 2 ug/min via INTRAVENOUS

## 2024-05-08 MED ORDER — PHENTOLAMINE MESYLATE 5 MG IJ SOLR
5.0000 mg | Freq: Once | INTRAMUSCULAR | Status: AC
  Administered 2024-05-08: 5 mg via SUBCUTANEOUS
  Filled 2024-05-08: qty 5

## 2024-05-08 MED ORDER — NEPRO/CARBSTEADY PO LIQD
237.0000 mL | Freq: Two times a day (BID) | ORAL | Status: DC
  Administered 2024-05-08 – 2024-05-14 (×3): 237 mL via ORAL

## 2024-05-08 MED ORDER — VANCOMYCIN HCL 750 MG/150ML IV SOLN: 750.0000 mg | Freq: Once | INTRAVENOUS | Status: DC

## 2024-05-08 MED ORDER — NOREPINEPHRINE 4 MG/250ML-% IV SOLN
INTRAVENOUS | Status: AC
  Filled 2024-05-08: qty 250

## 2024-05-08 MED ORDER — DEXTROSE 50 % IV SOLN: 25.0000 mL | Freq: Once | INTRAVENOUS | Status: AC

## 2024-05-08 MED ORDER — DEXTROSE 50 % IV SOLN
INTRAVENOUS | Status: AC
  Administered 2024-05-08: 25 mL via INTRAVENOUS
  Filled 2024-05-08: qty 50

## 2024-05-08 MED ORDER — VANCOMYCIN HCL 750 MG/150ML IV SOLN: 750.0000 mg | INTRAVENOUS | Status: DC | PRN

## 2024-05-08 MED ORDER — RENA-VITE PO TABS
1.0000 | ORAL_TABLET | Freq: Every day | ORAL | Status: DC
  Administered 2024-05-09 – 2024-05-14 (×6): 1 via ORAL
  Filled 2024-05-08 (×7): qty 1

## 2024-05-08 MED ORDER — PANTOPRAZOLE SODIUM 40 MG IV SOLR
40.0000 mg | INTRAVENOUS | Status: DC
  Administered 2024-05-08 – 2024-05-13 (×6): 40 mg via INTRAVENOUS
  Filled 2024-05-08 (×6): qty 10

## 2024-05-08 NOTE — Evaluation (Signed)
 Physical Therapy Evaluation Patient Details Name: Cynthia Harvey MRN: [id number] DOB: 03-Feb-1951 Today's Date: 05/08/2024  History of Present Illness  Patient is a 73 year old female admitted to ICU for management of spetic shock, requiring vasopressor support for BP management. PMH: ESRD on HD, SLE, pseudomonal peritonitis  Clinical Impression  Patient is agreeable to PT evaluation. She usually is independent at lives at home alone in a mobile home with 2 steps to enter. She has been at [facility] for short term rehab following another hospital stay.  Today the patient required assistance with all mobility. Decreased overall activity tolerance and generalized weakness. No dizziness with activity with vitals monitored throughout session. She is hopeful to return to rehab before going home alone. PT will continue to follow to maximize independence and decrease caregiver burden.       If plan is discharge home, recommend the following: A little help with walking and/or transfers;A little help with bathing/dressing/bathroom;Assist for transportation;Supervision due to cognitive status;Assistance with cooking/housework   Can travel by private vehicle   No    Equipment Recommendations  (TBD)  Recommendations for Other Services       Functional Status Assessment Patient has had a recent decline in their functional status and demonstrates the ability to make significant improvements in function in a reasonable and predictable amount of time.     Precautions / Restrictions Precautions Precautions: Fall Recall of Precautions/Restrictions: Intact Restrictions Weight Bearing Restrictions Per Provider Order: No      Mobility  Bed Mobility Overal bed mobility: Needs Assistance Bed Mobility: Supine to Sit     Supine to sit: Min assist, +2 for physical assistance     General bed mobility comments: increased time    Transfers Overall transfer level: Needs assistance Equipment used: 1  person hand held assist Transfers: Sit to/from Stand, Bed to chair/wheelchair/BSC Sit to Stand: Min assist, +2 physical assistance   Step pivot transfers: Min assist, +2 physical assistance       General transfer comment: cues for safety and positioning    Ambulation/Gait               General Gait Details: generally low activity tolerance in standing. did not assess ambulation.  Stairs            Wheelchair Mobility     Tilt Bed    Modified Rankin (Stroke Patients Only)       Balance Overall balance assessment: Needs assistance Sitting-balance support: Feet supported Sitting balance-Leahy Scale: Good     Standing balance support: Bilateral upper extremity supported Standing balance-Leahy Scale: Fair Standing balance comment: with hand held assistance                             Pertinent Vitals/Pain Pain Assessment Pain Assessment: Faces Faces Pain Scale: Hurts little more Pain Location: right arm around IV Pain Descriptors / Indicators: Discomfort Pain Intervention(s): Other (comment), Repositioned, Monitored during session (heating pad in place already)    Home Living Family/patient expects to be discharged to:: Skilled nursing facility                        Prior Function Prior Level of Function : Needs assist             Mobility Comments: was previously independent at living at home alone. she recently was at [facility] for short term rehab with standing/walking short distance with  assistance ADLs Comments: Ind at baseline and driving herself to HD     Extremity/Trunk Assessment   Upper Extremity Assessment Upper Extremity Assessment: Generalized weakness    Lower Extremity Assessment Lower Extremity Assessment: Generalized weakness       Communication   Communication Communication: No apparent difficulties    Cognition Arousal: Alert Behavior During Therapy: WFL for tasks assessed/performed, Flat affect    PT - Cognitive impairments: No apparent impairments                         Following commands: Intact       Cueing Cueing Techniques: Verbal cues     General Comments General comments (skin integrity, edema, etc.): MAP montiored throughout session > 80 with activity, heart rate elevated slightly with activity.    Exercises     Assessment/Plan    PT Assessment Patient needs continued PT services  PT Problem List Decreased strength;Decreased range of motion;Decreased activity tolerance;Decreased balance;Decreased mobility;Decreased knowledge of use of DME       PT Treatment Interventions DME instruction;Gait training;Stair training;Functional mobility training;Therapeutic activities;Therapeutic exercise;Balance training;Neuromuscular re-education;Cognitive remediation;Patient/family education    PT Goals (Current goals can be found in the Care Plan section)  Acute Rehab PT Goals Patient Stated Goal: back to rehab before going home PT Goal Formulation: With patient Time For Goal Achievement: 05/22/24 Potential to Achieve Goals: Fair    Frequency Min 2X/week     Co-evaluation PT/OT/SLP Co-Evaluation/Treatment: Yes Reason for Co-Treatment: Complexity of the patient's impairments (multi-system involvement) PT goals addressed during session: Mobility/safety with mobility         AM-PAC PT 6 Clicks Mobility  Outcome Measure Help needed turning from your back to your side while in a flat bed without using bedrails?: A Little Help needed moving from lying on your back to sitting on the side of a flat bed without using bedrails?: A Little Help needed moving to and from a bed to a chair (including a wheelchair)?: A Little Help needed standing up from a chair using your arms (e.g., wheelchair or bedside chair)?: A Little Help needed to walk in hospital room?: A Lot Help needed climbing 3-5 steps with a railing? : A Lot 6 Click Score: 16    End of Session    Activity Tolerance: Patient limited by fatigue Patient left: in chair;with call bell/phone within reach Nurse Communication: Mobility status PT Visit Diagnosis: Muscle weakness (generalized) (M62.81);Unsteadiness on feet (R26.81)    Time: 9096-9075 PT Time Calculation (min) (ACUTE ONLY): 21 min   Charges:   PT Evaluation $PT Eval Moderate Complexity: 1 Mod   PT General Charges $$ ACUTE PT VISIT: 1 Visit         Randine Essex, PT, MPT   Randine LULLA Essex 05/08/2024, 11:25 AM

## 2024-05-08 NOTE — TOC Initial Note (Signed)
 Transition of Care [facility]) - Initial/Assessment Note    Patient Details  Name: Cynthia Harvey MRN: [id number] Date of Birth: 12-22-1950  Transition of [facility]) CM/SW Contact:    Corrie JINNY Ruts, LCSW Phone Number: 05/08/2024, 12:46 PM  Clinical Narrative:                 Chart reviewed. The patient came from [facility]. The patent was admitted for Septic Shock. I was able to speak with the patient at bedside today. I introduced myself, my role, and reason for consult. The patient confirms that she has a PCP and she is currently a resident a [facility].   The patient reports that she needed assistance before being admitted. The patient reports that her son will help her during D/C. The patient reports that she uses psychologist, forensic. The patient reports that she has had HH in the past. The patient reports that she uses no equipment.   The patient confirmed that she will return to [facility] at D/C. There are no other TOC needs at this time. TOC will follow the patient until D/C.     Barriers to Discharge: Continued Medical Work up   Patient Goals and CMS Choice            Expected Discharge Plan and Services       Living arrangements for the past 2 months: Skilled Nursing Facility                                      Prior Living Arrangements/Services Living arrangements for the past 2 months: Skilled Nursing Facility Lives with:: Facility Resident Patient language and need for interpreter reviewed:: Yes        Need for Family Participation in Patient Care: Yes (Comment) Care giver support system in place?: Yes (comment)   Criminal Activity/Legal Involvement Pertinent to Current Situation/Hospitalization: No - Comment as needed  Activities of Daily Living   ADL Screening (condition at time of admission) Independently performs ADLs?: No Does the patient have a NEW difficulty with bathing/dressing/toileting/self-feeding that is expected to last >3  days?: No Does the patient have a NEW difficulty with getting in/out of bed, walking, or climbing stairs that is expected to last >3 days?: No Does the patient have a NEW difficulty with communication that is expected to last >3 days?: No Is the patient deaf or have difficulty hearing?: No Does the patient have difficulty seeing, even when wearing glasses/contacts?: No Does the patient have difficulty concentrating, remembering, or making decisions?: No  Permission Sought/Granted                  Emotional Assessment Appearance:: Appears stated age Attitude/Demeanor/Rapport: Gracious Affect (typically observed): Calm Orientation: : Oriented to Place, Oriented to  Time, Oriented to Situation, Oriented to Self Alcohol / Substance Use: Not Applicable Psych Involvement: No (comment)  Admission diagnosis:  Hypoglycemia [E16.2] Hypotension [I95.9] Sepsis (HCC) [A41.9] Hypotension, unspecified hypotension type [I95.9] Patient Active Problem List   Diagnosis Date Noted   Septic shock (HCC) 05/06/2024   Hypotension, acute on chronic 05/06/2024   Hypoglycemia 05/06/2024   ESRD on hemodialysis (HCC) 05/06/2024   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(10/11-11/3/25) 05/06/2024   History of anemia due to chronic kidney disease 05/06/2024   Type 2 diabetes mellitus with other diabetic kidney complication (HCC) 12/17/2023   Systemic lupus erythematosus (HCC) 07/12/2023  PCP:  Henry Fitch, MD Pharmacy:  No Pharmacies Listed    Social Drivers of Health (SDOH) Social History: SDOH Screenings   Food Insecurity: No Food Insecurity (05/07/2024)  Housing: Unknown (05/07/2024)  Transportation Needs: No Transportation Needs (05/07/2024)  Utilities: Not At Risk (05/07/2024)  Financial Resource Strain: Low Risk (03/14/2024)   Received from [facility]  Physical Activity: Unknown (06/01/2023)   Received from [facility]  Social  Connections: Unknown (05/07/2024)  Stress: No Stress Concern Present (06/01/2023)   Received from [facility]  Tobacco Use: Low Risk  (05/06/2024)  Health Literacy: Low Risk (10/01/2020)   [facility] from [facility]   SDOH Interventions:     Readmission Risk Interventions     No data to display

## 2024-05-08 NOTE — Evaluation (Signed)
 Occupational Therapy Evaluation Patient Details Name: Cynthia Harvey MRN: [id number] DOB: 01-Jun-1951 Today's Date: 05/08/2024   History of Present Illness   Patient is a 73 year old female admitted to ICU for management of spetic shock, requiring vasopressor support for BP management. PMH: ESRD on HD, SLE, pseudomonal peritonitis     Clinical Impressions Patient presenting with decreased Ind in self care, balance, functional mobility/transfers, endurance, and safety awareness. Patient reports living at home alone and being Ind at baseline without use of AD. Pt drives self to HD and is very independent at baseline. She has recently been at [facility] for rehab stay and wishes to return at hospital discharge. Pt needing min A of 2 for mobility and self care needs this session. Pt fatigues quickly. She transfers from bed to recliner chair and breakfast tray set up in front of her. Pt is motivated to go to rehab and return home when ready. Patient will benefit from acute OT to increase overall independence in the areas of ADLs, functional mobility, and safety awareness in order to safely discharge.     If plan is discharge home, recommend the following:   A little help with walking and/or transfers;A little help with bathing/dressing/bathroom;Assistance with cooking/housework;Assist for transportation;Help with stairs or ramp for entrance     Functional Status Assessment   Patient has had a recent decline in their functional status and demonstrates the ability to make significant improvements in function in a reasonable and predictable amount of time.     Equipment Recommendations   Other (comment) (defer to next venue of care)      Precautions/Restrictions   Precautions Precautions: Fall Recall of Precautions/Restrictions: Intact     Mobility Bed Mobility Overal bed mobility: Needs Assistance Bed Mobility: Supine to Sit     Supine to sit: Min assist, +2 for  physical assistance     General bed mobility comments: increased time    Transfers Overall transfer level: Needs assistance Equipment used: 1 person hand held assist Transfers: Sit to/from Stand, Bed to chair/wheelchair/BSC Sit to Stand: Min assist, +2 physical assistance     Step pivot transfers: Min assist, +2 physical assistance     General transfer comment: cues for safety and positioning      Balance Overall balance assessment: Needs assistance Sitting-balance support: Feet supported Sitting balance-Leahy Scale: Good     Standing balance support: Bilateral upper extremity supported Standing balance-Leahy Scale: Fair Standing balance comment: with hand held assistance                           ADL either performed or assessed with clinical judgement   ADL Overall ADL's : Needs assistance/impaired                         Toilet Transfer: Minimal assistance;Rolling walker (2 wheels) Toilet Transfer Details (indicate cue type and reason): simulated                 Vision Patient Visual Report: No change from baseline              Pertinent Vitals/Pain Pain Assessment Pain Assessment: Faces Faces Pain Scale: Hurts little more Pain Location: right arm around IV Pain Descriptors / Indicators: Discomfort, Grimacing Pain Intervention(s): Monitored during session, Repositioned, Heat applied     Extremity/Trunk Assessment Upper Extremity Assessment Upper Extremity Assessment: Generalized weakness   Lower Extremity Assessment Lower Extremity Assessment:  Generalized weakness       Communication Communication Communication: No apparent difficulties   Cognition Arousal: Alert Behavior During Therapy: WFL for tasks assessed/performed, Flat affect Cognition: No apparent impairments                               Following commands: Intact       Cueing  General Comments   Cueing Techniques: Verbal cues  MAP  montiored throughout session > 80 with activity, heart rate elevated slightly with activity.           Home Living Family/patient expects to be discharged to:: Skilled nursing facility                                        Prior Functioning/Environment Prior Level of Function : Needs assist             Mobility Comments: was previously independent at living at home alone. she recently was at [facility] for short term rehab with standing/walking short distance with assistance ADLs Comments: Ind at baseline and driving herself to HD    OT Problem List: Decreased strength;Decreased activity tolerance;Impaired balance (sitting and/or standing);Decreased safety awareness;Pain   OT Treatment/Interventions: Self-care/ADL training;Manual therapy;Therapeutic exercise;Patient/family education;Balance training;Energy conservation;Therapeutic activities      OT Goals(Current goals can be found in the care plan section)   Acute Rehab OT Goals Patient Stated Goal: to return to rehab OT Goal Formulation: With patient Time For Goal Achievement: 05/22/24 Potential to Achieve Goals: Fair ADL Goals Pt Will Perform Grooming: with supervision;standing Pt Will Perform Lower Body Dressing: with supervision;sit to/from stand Pt Will Transfer to Toilet: with supervision;ambulating Pt Will Perform Toileting - Clothing Manipulation and hygiene: with supervision;sit to/from stand   OT Frequency:  Min 2X/week    Co-evaluation   Reason for Co-Treatment: Complexity of the patient's impairments (multi-system involvement) PT goals addressed during session: Mobility/safety with mobility        AM-PAC OT 6 Clicks Daily Activity     Outcome Measure Help from another person eating meals?: None Help from another person taking care of personal grooming?: None Help from another person toileting, which includes using toliet, bedpan, or urinal?: A Little Help from another person bathing  (including washing, rinsing, drying)?: A Little Help from another person to put on and taking off regular upper body clothing?: None Help from another person to put on and taking off regular lower body clothing?: A Little 6 Click Score: 21   End of Session Nurse Communication: Mobility status  Activity Tolerance: Patient tolerated treatment well Patient left: in chair;with call bell/phone within reach  OT Visit Diagnosis: Unsteadiness on feet (R26.81);Muscle weakness (generalized) (M62.81)                Time: 9086-9075 OT Time Calculation (min): 11 min Charges:  OT General Charges $OT Visit: 1 Visit OT Evaluation $OT Eval Low Complexity: 1 Low  Izetta Claude, MS, OTR/L , CBIS ascom [phone]  05/08/24, 11:30 AM

## 2024-05-08 NOTE — Progress Notes (Signed)
 NAME:  Cynthia Harvey, MRN:  [id number], DOB:  [birth year], LOS: 1 ADMISSION DATE:  05/06/2024, CONSULTATION DATE: 05/07/2024 REFERRING MD: Dr. Marsa, CHIEF COMPLAINT: Hypotension   History of Present Illness:  This is a 73 yo female via EMS from [facility] who presented to [facility] ER on 11/12 following a dialysis treatment she developed hypotension when she got back to [facility].  At the facility she received her scheduled fludrocortisone and midodrine 20 mg.  She was also found to be hypoglycemic CBG 54, therefore she was instructed to drink 3 cups of orange juice.  However, she was unable to drink all of it because she began to vomit.    She was recently transitioned from PD to HD during hospitalization at [facility] [place] on 04/05/2024 to 04/21/2024 via a right tunneled catheter placed on 04/09/24.  However, she later required right tunneled hemodialysis catheter exchange on 04/23/2024 due to malfunctioning catheter.     ED Course  Upon arrival to the ER vital signs were: temp 97.7 F/sbp 85/hr 106/O2 sats 100%.  Significant lab results were: chloride 97/creatinine 3.76/AST 65/wbc 15.7/hgb 10.9/lactic acid 2.9.  CXR negative acute cardiopulmonary abnormality.  EKG revealed NSR, hr 80, no signs of ST elevation. Pt received 250 ml bolus due to hypotension.  Pt admitted to the progressive care unit per hospitalist team, however remained in the ED pending bed availability.  See detailed hospital course below under significant events.    Pertinent  Medical History  ESRD (transitioned from PD to HD during hospitalization at [facility] 10/11 to 04/21/2024 due to PD associated pseudomonas peritonitis) Anemia Due to CKD  Systemic Lupus Erythematosus  Hypotension Hypoglycemia  Atrial Fibrillation  Obesity   Micro Data:   MRSA PCR 11/12>>negative  Blood x1 11/12>>NGTD  Blood x1 11/12>>NGTD   Significant Hospital Events: Including procedures, antibiotic start and stop dates in addition to other  pertinent events   11/11: Admitted to the progressive care unit but remained in the ER pending bed availability with sepsis suspected secondary to bacterial peritonitis  11/12: Pt developed hypotension requiring peripheral levophed gtt and transfer to ICU PCCM assumed care.  Abxs broadened to meropenum and vancomycin.  IR consulted for paracentesis, however no ascites visible on ultrasound to allow for safe approach for paracentesis  11/13: Pt remains on levophed gtt @3  mcg/min to maintain map >65.  Pt c/o nausea CT Abd/Pelvis pending   Interim History / Subjective:  Pt currently requiring levophed gtt @3  mcg/min to maintain map >65  Objective    Blood pressure (!) 119/50, pulse 68, temperature 97.6 F (36.4 C), resp. rate 17, height 4' 9 (1.448 m), weight 70.3 kg, SpO2 100%.        Intake/Output Summary (Last 24 hours) at 05/08/2024 0816 Last data filed at 05/08/2024 0700 Gross per 24 hour  Intake 1777.85 ml  Output --  Net 1777.85 ml   Filed Weights   05/06/24 1442  Weight: 70.3 kg   Examination: General: Acutely-ill appearing female, NAD on RA   HENT: Supple, no JVD  Lungs: Clear throughout, even, non labored  Cardiovascular: NSR, s1s2, no m/r/g, 2+ radial/2+ distal pulses, no edema  Abdomen: +BS x4, obese, soft, non tender, non distended  Extremities: Normal bulk and tone, moves all extremities  Skin: LLQ Abdominal wound from PD catheter replacement present on admission (see image below)   Neuro: Alert and oriented, follows commands, PERRLA  GU: Deferred   Resolved problem list   Assessment and Plan   #Circulatory  shock  #Elevated troponin suspect secondary to demand ischemia  Hx: Chronic hypotension, and atrial fibrillation  - Continuous telemetry monitoring  - Continue outpatient atorvastatin, midodrine and fludrocortisone - Prn levophed gtt to maintain map 60 or higher and/or sbp 80 or higher  - Trend troponin until peaked   #ESRD on HD  #Hypokalemia  -  Trend BMP  - Replace electrolytes as indicated  - Nephrology consulted appreciate input: HD per recommendations   #Concern for possible SBP  #Nausea/vomiting   - Trend WBC and monitor fever curve - Follow cultures  - Continue meropenum and vancomycin for now pending culture results and sensitivities  - KUB negative  - IR unable to perform paracentesis no ascites visible on US  to allow for safe approach - CT Abd/Pelvis pending   #Systemic Lupus Erythematosus  - Will hold outpatient hydroxychloroquine for now given concern for sepsis   #Anemia without signs of active bleeding  - Trend CBC  - Monitor for s/sx of bleeding  - Transfuse for hgb <7   #Hypoglycemia  - Continue renal diet with 1200 ml fluid restriction  - CBG's q4hrs  - Follow hyper/hypoglycemic protocol  - Target CBG readings 140 to 180 while in ICU   Labs   CBC: Recent Labs  Lab 05/06/24 1509 05/07/24 0552 05/08/24 0451  WBC 15.7* 18.1* 15.3*  NEUTROABS 13.3*  --   --   HGB 10.9* 8.3* 9.5*  HCT 35.7* 27.5* 31.2*  MCV 102.3* 104.2* 104.3*  PLT 347 255 279    Basic Metabolic Panel: Recent Labs  Lab 05/06/24 1509 05/07/24 0552 05/07/24 1418 05/08/24 0451  NA 141 141  --  136  K 3.7 3.3*  --  3.4*  CL 97* 104  --  100  CO2 31 27  --  23  GLUCOSE 95 76  --  92  BUN 8 9  --  13  CREATININE 3.76* 4.75*  --  5.64*  CALCIUM 9.3 8.4*  --  8.9  MG 1.7  --  1.8  --   PHOS  --   --  2.0* 3.2   GFR: Estimated Creatinine Clearance: 7.4 mL/min (A) (by C-G formula based on SCr of 5.64 mg/dL (H)). Recent Labs  Lab 05/06/24 1509 05/06/24 1731 05/06/24 1943 05/07/24 0552 05/07/24 0845 05/07/24 1418 05/08/24 0451  PROCALCITON  --   --   --   --   --  1.16  --   WBC 15.7*  --   --  18.1*  --   --  15.3*  LATICACIDVEN  --  2.9* 3.5*  --  1.0  --   --     Liver Function Tests: Recent Labs  Lab 05/06/24 1509 05/07/24 1418  AST 65* 45*  ALT 20 19  ALKPHOS 84 70  BILITOT 0.4 0.3  PROT 7.2 5.4*   ALBUMIN 4.0 2.9*   No results for input(s): LIPASE, AMYLASE in the last 168 hours. No results for input(s): AMMONIA in the last 168 hours.  ABG No results found for: PHART, PCO2ART, PO2ART, HCO3, TCO2, ACIDBASEDEF, O2SAT   Coagulation Profile: Recent Labs  Lab 05/07/24 0552  INR 1.0    Cardiac Enzymes: No results for input(s): CKTOTAL, CKMB, CKMBINDEX, TROPONINI in the last 168 hours.  HbA1C: No results found for: HGBA1C  CBG: Recent Labs  Lab 05/07/24 1603 05/07/24 1958 05/07/24 2351 05/08/24 0424 05/08/24 0747  GLUCAP 97 86 94 77 79    Review of Systems: Positives in BOLD  Gen: Denies fever, chills, weight change, fatigue, night sweats HEENT: Denies blurred vision, double vision, hearing loss, tinnitus, sinus congestion, rhinorrhea, sore throat, neck stiffness, dysphagia PULM: Denies shortness of breath, cough, sputum production, hemoptysis, wheezing CV: hypotension, chest pain, edema, orthopnea, paroxysmal nocturnal dyspnea, palpitations GI:  abdominal pain, nausea, vomiting, diarrhea, hematochezia, melena, constipation, change in bowel habits GU: Denies dysuria, hematuria, polyuria, oliguria, urethral discharge Endocrine: Denies hot or cold intolerance, polyuria, polyphagia or appetite change Derm: Denies rash, dry skin, scaling or peeling skin change Heme: Denies easy bruising, bleeding, bleeding gums Neuro: Denies headache, numbness, weakness, slurred speech, loss of memory or consciousness  Past Medical History:  She,  has no past medical history on file.   Surgical History:  History reviewed. No pertinent surgical history.   Social History:   reports that she has never smoked. She has never used smokeless tobacco. She reports that she does not drink alcohol and does not use drugs.   Family History:  Her family history includes Breast cancer (age of onset: 36) in her mother.   Allergies Allergies  Allergen Reactions    Hydrochlorothiazide Hives   Lisinopril Hives    Angioedema   Spironolactone Other (See Comments)    Other Reaction(s): Breast Pain  Breast pain   Empagliflozin Dermatitis     Home Medications  Prior to Admission medications   Medication Sig Start Date End Date Taking? Authorizing Provider  acetaminophen (TYLENOL) 325 MG tablet Take 650 mg by mouth every 4 (four) hours as needed for mild pain (pain score 1-3). 04/21/24  Yes [provider]  atorvastatin (LIPITOR) 20 MG tablet Take 20 mg by mouth daily. 06/16/22 06/25/24 Yes [provider]  cinacalcet (SENSIPAR) 60 MG tablet Take 60 mg by mouth daily. 12/13/23  Yes [provider]  epoetin alfa-epbx (RETACRIT) 10000 UNIT/ML injection Inject 10,000 Units into the skin 3 (three) times a week. Monday, Wednesday, Friday 04/23/24  Yes [provider]  fludrocortisone (FLORINEF) 0.1 MG tablet Take 0.1 mg by mouth daily. 03/28/24 03/28/25 Yes [provider]  fluticasone furoate-vilanterol (BREO ELLIPTA) 100-25 MCG/ACT AEPB Inhale 1 puff into the lungs daily. 03/27/24  Yes [provider]  gabapentin (NEURONTIN) 300 MG capsule Take 300 mg by mouth 2 (two) times daily. 10/24/22  Yes [provider]  hydroxychloroquine (PLAQUENIL) 200 MG tablet Take 200 mg by mouth daily.   Yes [provider]  lidocaine (LIDODERM) 5 % Place 1 patch onto the skin daily. Remove & Discard patch within 12 hours or as directed by MD   Yes [provider]  midodrine (PROAMATINE) 10 MG tablet Take 20 mg by mouth 3 (three) times daily. 03/27/24 07/25/24 Yes [provider]  ondansetron (ZOFRAN-ODT) 4 MG disintegrating tablet Take 4 mg by mouth every 8 (eight) hours as needed for nausea or vomiting.   Yes [provider]     Critical care time: 35 minutes      Lonell Moose, AGNP  Pulmonary/Critical Care Pager [phone] (please enter 7 digits) PCCM Consult Pager [phone]  (please enter 7 digits)

## 2024-05-08 NOTE — Progress Notes (Signed)
 [facility]  ROUNDING NOTE   Subjective:   Patient seen sitting up in chair Alert and oriented Room air, denies shortness of breath Levo off  Blood pressure soft  Objective:  Vital signs in last 24 hours:  Temp:  [97.4 F (36.3 C)-97.9 F (36.6 C)] 97.4 F (36.3 C) (11/13 1152) Pulse Rate:  [66-93] 83 (11/13 1200) Resp:  [10-22] 15 (11/13 1300) BP: (78-120)/(42-98) 99/52 (11/13 1300) SpO2:  [97 %-100 %] 100 % (11/13 1300)  Weight change:  Filed Weights   05/06/24 1442  Weight: 70.3 kg    Intake/Output: I/O last 3 completed shifts: In: 1777.9 [I.V.:1025.8; IV Piggyback:752.1] Out: -    Intake/Output this shift:  Total I/O In: 267.6 [P.O.:240; I.V.:27.6] Out: 350 [Emesis/NG output:350]  Physical Exam: General: NAD, seated in chair, ill apparing  Head: Normocephalic, atraumatic. Moist oral mucosal membranes  Eyes: Anicteric  Lungs:  Clear to auscultation, normal effort  Heart: Regular rate and rhythm  Abdomen:  Soft, nontender  Extremities:  No peripheral edema.  Neurologic: Awake, alert, conversant  Skin: Warm,dry, no rash  Access: Rt internal jugular permcath    Basic Metabolic Panel: Recent Labs  Lab 05/06/24 1509 05/07/24 0552 05/07/24 1418 05/08/24 0451  NA 141 141  --  136  K 3.7 3.3*  --  3.4*  CL 97* 104  --  100  CO2 31 27  --  23  GLUCOSE 95 76  --  92  BUN 8 9  --  13  CREATININE 3.76* 4.75*  --  5.64*  CALCIUM 9.3 8.4*  --  8.9  MG 1.7  --  1.8  --   PHOS  --   --  2.0* 3.2    Liver Function Tests: Recent Labs  Lab 05/06/24 1509 05/07/24 1418  AST 65* 45*  ALT 20 19  ALKPHOS 84 70  BILITOT 0.4 0.3  PROT 7.2 5.4*  ALBUMIN 4.0 2.9*   No results for input(s): LIPASE, AMYLASE in the last 168 hours. No results for input(s): AMMONIA in the last 168 hours.  CBC: Recent Labs  Lab 05/06/24 1509 05/07/24 0552 05/08/24 0451  WBC 15.7* 18.1* 15.3*  NEUTROABS 13.3*  --   --   HGB 10.9* 8.3* 9.5*  HCT 35.7*  27.5* 31.2*  MCV 102.3* 104.2* 104.3*  PLT 347 255 279    Cardiac Enzymes: No results for input(s): CKTOTAL, CKMB, CKMBINDEX, TROPONINI in the last 168 hours.  BNP: Invalid input(s): POCBNP  CBG: Recent Labs  Lab 05/07/24 1958 05/07/24 2351 05/08/24 0424 05/08/24 0747 05/08/24 1127  GLUCAP 86 94 77 79 71    Microbiology: Results for orders placed or performed during the hospital encounter of 05/06/24  Blood culture (single)     Status: None (Preliminary result)   Collection Time: 05/06/24 11:05 PM   Specimen: BLOOD  Result Value Ref Range Status   Specimen Description BLOOD BLOOD RIGHT HAND  Final   Special Requests   Final    BOTTLES DRAWN AEROBIC AND ANAEROBIC Blood Culture adequate volume   Culture   Final    NO GROWTH 2 DAYS Performed at [facility], [address], [place], [place] [address]    Report Status PENDING  Incomplete  MRSA Next Gen by PCR, Nasal     Status: None   Collection Time: 05/07/24  9:37 AM   Specimen: Nasal Mucosa; Nasal Swab  Result Value Ref Range Status   MRSA by PCR Next Gen NOT DETECTED NOT  DETECTED Final    Comment: (NOTE) The GeneXpert MRSA Assay (FDA approved for NASAL specimens only), is one component of a comprehensive MRSA colonization surveillance program. It is not intended to diagnose MRSA infection nor to guide or monitor treatment for MRSA infections. Test performance is not FDA approved in patients less than 52 years old. Performed at [facility], [address] [place], [place] [address]   Culture, blood (single) w Reflex to ID Panel     Status: None (Preliminary result)   Collection Time: 05/07/24 11:49 AM   Specimen: BLOOD  Result Value Ref Range Status   Specimen Description BLOOD BLOOD LEFT HAND  Final   Special Requests   Final    BOTTLES DRAWN AEROBIC AND ANAEROBIC Blood Culture adequate volume   Culture   Final    NO GROWTH < 24 HOURS Performed at [facility], [address], [place], [place] [address]    Report Status PENDING  Incomplete  Resp panel by RT-PCR (RSV, Flu A&B, Covid) Anterior Nasal Swab     Status: None   Collection Time: 05/07/24  3:12 PM   Specimen: Anterior Nasal Swab  Result Value Ref Range Status   SARS Coronavirus 2 by RT PCR NEGATIVE NEGATIVE Final    Comment: (NOTE) SARS-CoV-2 target nucleic acids are NOT DETECTED.  The SARS-CoV-2 RNA is generally detectable in upper respiratory specimens during the acute phase of infection. The lowest concentration of SARS-CoV-2 viral copies this assay can detect is 138 copies/mL. A negative result does not preclude SARS-Cov-2 infection and should not be used as the sole basis for treatment or other patient management decisions. A negative result may occur with  improper specimen collection/handling, submission of specimen other than nasopharyngeal swab, presence of viral mutation(s) within the areas targeted by this assay, and inadequate number of viral copies(<138 copies/mL). A negative result must be combined with clinical observations, patient history, and epidemiological information. The expected result is Negative.  Fact Sheet for Patients:  bloggercourse.com  Fact Sheet for Healthcare Providers:  seriousbroker.it  This test is no t yet approved or cleared by the [place]  FDA and  has been authorized for detection and/or diagnosis of SARS-CoV-2 by FDA under an Emergency Use Authorization (EUA). This EUA will remain  in effect (meaning this test can be used) for the duration of the COVID-19 declaration under Section 564(b)(1) of the Act, 21 U.S.C.section 360bbb-3(b)(1), unless the authorization is terminated  or revoked sooner.       Influenza A by PCR NEGATIVE NEGATIVE Final   Influenza B by PCR NEGATIVE NEGATIVE Final    Comment: (NOTE) The Xpert Xpress SARS-CoV-2/FLU/RSV plus assay is intended as an aid in the  diagnosis of influenza from Nasopharyngeal swab specimens and should not be used as a sole basis for treatment. Nasal washings and aspirates are unacceptable for Xpert Xpress SARS-CoV-2/FLU/RSV testing.  Fact Sheet for Patients: bloggercourse.com  Fact Sheet for Healthcare Providers: seriousbroker.it  This test is not yet approved or cleared by the [place]  FDA and has been authorized for detection and/or diagnosis of SARS-CoV-2 by FDA under an Emergency Use Authorization (EUA). This EUA will remain in effect (meaning this test can be used) for the duration of the COVID-19 declaration under Section 564(b)(1) of the Act, 21 U.S.C. section 360bbb-3(b)(1), unless the authorization is terminated or revoked.     Resp Syncytial Virus by PCR NEGATIVE NEGATIVE Final    Comment: (NOTE) Fact Sheet for Patients: bloggercourse.com  Fact  Sheet for Healthcare Providers: seriousbroker.it  This test is not yet approved or cleared by the [place]  FDA and has been authorized for detection and/or diagnosis of SARS-CoV-2 by FDA under an Emergency Use Authorization (EUA). This EUA will remain in effect (meaning this test can be used) for the duration of the COVID-19 declaration under Section 564(b)(1) of the Act, 21 U.S.C. section 360bbb-3(b)(1), unless the authorization is terminated or revoked.  Performed at [facility], [address] [place], [place] [address]     Coagulation Studies: Recent Labs    05/07/24 0552  LABPROT 13.3  INR 1.0    Urinalysis: No results for input(s): COLORURINE, LABSPEC, PHURINE, GLUCOSEU, HGBUR, BILIRUBINUR, KETONESUR, PROTEINUR, UROBILINOGEN, NITRITE, LEUKOCYTESUR in the last 72 hours.  Invalid input(s): APPERANCEUR    Imaging: CT ABDOMEN PELVIS WO CONTRAST Result Date: 05/08/2024 EXAM: CT ABDOMEN AND PELVIS  WITHOUT CONTRAST 05/08/2024 09:57:55 AM TECHNIQUE: CT of the abdomen and pelvis was performed without the administration of intravenous contrast. Multiplanar reformatted images are provided for review. Automated exposure control, iterative reconstruction, and/or weight-based adjustment of the mA/kV was utilized to reduce the radiation dose to as low as reasonably achievable. COMPARISON: 07/24/2012 CLINICAL HISTORY: Sepsis FINDINGS: LOWER CHEST: Hemodialysis catheter in place, tip at the cavoatrial junction. Posterior bibasilar dependent atelectasis. LIVER: The liver is unremarkable. GALLBLADDER AND BILE DUCTS: Decompressed gallbladder. No radiopaque gallstones. No biliary ductal dilatation. SPLEEN: No acute abnormality. PANCREAS: No acute abnormality. ADRENAL GLANDS: No acute abnormality. KIDNEYS, URETERS AND BLADDER: Multiple bilateral renal cysts. The largest measures 2.5 cm in the right interpolar region, which is likely a proteinaceous cyst. No stones in the kidneys or ureters. No hydronephrosis. No perinephric or periureteral stranding. Urinary bladder is unremarkable. GI AND BOWEL: Stomach demonstrates no acute abnormality. The appendix was not visualized. No pericecal or right lower quadrant inflammatory stranding to suggest acute appendicitis. There is no bowel obstruction. PERITONEUM AND RETROPERITONEUM: No ascites. No free air. VASCULATURE: Aorta is normal in caliber. LYMPH NODES: No lymphadenopathy. REPRODUCTIVE ORGANS: Age-related atrophy of the uterus and ovaries. No free pelvic fluid. BONES AND SOFT TISSUES: Osteopenia. Multilevel degenerative disc disease of the spine. Minimal grade 1 anterolisthesis of L4 on L5. 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 Multiple small soft tissue nodules within the ventral abdominal wall with areas of subcutaneous gas, likely reflecting changes of subcutaneous injection. In the left upper  quadrant, there is a focal region of encapsulated, inflamed fat measuring 2.5 x 4.4 cm (axial 35). Additionally, there is a similar region of inflamed fat along the left periumbilical ventral abdominal wall measuring 2.8 x 4.7 cm (axial 56), both likely reflecting fat necrosis. Small ovoid fluid collection in the left lower quadrant abdominal wall measuring 2.4 x 1.7 cm (axial 55), possibly related to subcutaneous injection for an evolving hematoma. IMPRESSION: 1. No acute findings in the abdomen or pelvis. 2. Small ovoid fluid collection in the left lower quadrant abdominal wall measuring 2.4 x 1.7 cm (axial 55), possibly related to subcutaneous injection or an evolving hematoma. Electronically signed by: Rogelia Myers MD 05/08/2024 10:58 AM EST RP Workstation: [id number]   US  Abdomen Limited Result Date: 05/07/2024 CLINICAL DATA:  73 year old female currently admitted with sepsis thought to be related to possible bacterial peritonitis. Request for diagnostic paracentesis. EXAM: ULTRASOUND ABDOMEN LIMITED COMPARISON:  None FINDINGS: Limited ultrasound done of all 4 quadrants. There is no ascites present. There is no pocket of fluid large enough to  allow for safe approach for paracentesis. IMPRESSION: No ascites visible on ultrasound to allow for safe approach for paracentesis. Ultrasound performed by: Sherrilee Bal, PA-C Electronically Signed   By: Cordella Banner   On: 05/07/2024 13:17   DG Abd 1 View Result Date: 05/07/2024 CLINICAL DATA:  Nausea vomiting. EXAM: ABDOMEN - 1 VIEW COMPARISON:  None Available. FINDINGS: No gaseous small bowel or colonic dilatation. No unexpected abdominopelvic calcification. Chronic changes noted at the symphysis pubis. IMPRESSION: Nonobstructive bowel gas pattern. Electronically Signed   By: Camellia Candle M.D.   On: 05/07/2024 10:44   DG Chest Port 1 View Result Date: 05/06/2024 CLINICAL DATA:  Hypotension. EXAM: PORTABLE CHEST 1 VIEW COMPARISON:  None Available.  FINDINGS: Right IJ central venous catheter has tip over the SVC just above the cavoatrial junction. Lungs are hypoinflated and otherwise clear. Cardiomediastinal silhouette is normal. Mild prominent overlying soft tissues. Bony structures are unremarkable. IMPRESSION: Hypoinflation without acute cardiopulmonary disease. Electronically Signed   By: Toribio Agreste M.D.   On: 05/06/2024 18:37     Medications:    meropenem (MERREM) IV Stopped (05/07/24 2217)    atorvastatin  20 mg Oral Daily   Chlorhexidine Gluconate Cloth  6 each Topical Q0600   cinacalcet  60 mg Oral Q breakfast   feeding supplement (NEPRO CARB STEADY)  237 mL Oral BID BM   fludrocortisone  0.1 mg Oral Daily   fluticasone furoate-vilanterol  1 puff Inhalation Daily   gabapentin  300 mg Oral BID   heparin  5,000 Units Subcutaneous Q8H   midodrine  20 mg Oral TID WC   multivitamin  1 tablet Oral QHS   acetaminophen **OR** acetaminophen, HYDROcodone-acetaminophen, ondansetron **OR** ondansetron (ZOFRAN) IV  Assessment/ Plan:  Ms. Cynthia Harvey is a 73 y.o.  female  with past medical history of SLE, pAfib, chronic hypotension and end stage renal disease on hemodialysis, who was admitted to [facility] on 05/06/2024 for Hypoglycemia [E16.2] Hypotension [I95.9] Sepsis (HCC) [A41.9] Hypotension, unspecified hypotension type [I95.9]   End-stage renal disease on hemodialysis.Patient was scheduled for dialysis today however, hypotension prevents this from being completed safely. Blood pressure during outpatient treatments runs 110s-120s. Will assess for dialysis tomorrow.   2. Anemia of chronic kidney disease Lab Results  Component Value Date   HGB 9.5 (L) 05/08/2024  Patient receives Mircera at outpatient clinic. Will order low-dose Retacrit with next dialysis.   3. Secondary Hyperparathyroidism: with outpatient labs: PTH 366, phosphorus 4.5, calcium 8.6 on 11//25    Lab Results  Component Value Date   CALCIUM 8.9  05/08/2024   PHOS 3.2 05/08/2024    Patient prescribed calcitriol, cholecalciferol, Sensipar, and sevelamer outpatient. Continue Sensipar.   4. Hypotension likely secondary to sepsis. Suspected bacterial peritonitis . Recent PD catheter removal. Elevated white count and lactic acid. Primary team has ordered meropenem and vancomycin.    LOS: 1 Caydon Feasel 11/13/20251:46 PM

## 2024-05-09 LAB — CBC WITH DIFFERENTIAL/PLATELET
Abs Immature Granulocytes: 0.07 K/uL (ref 0.00–0.07)
Basophils Absolute: 0.1 K/uL (ref 0.0–0.1)
Basophils Relative: 0 %
Eosinophils Absolute: 0.2 K/uL (ref 0.0–0.5)
Eosinophils Relative: 2 %
HCT: 29.9 % — ABNORMAL LOW (ref 36.0–46.0)
Hemoglobin: 9.3 g/dL — ABNORMAL LOW (ref 12.0–15.0)
Immature Granulocytes: 1 %
Lymphocytes Relative: 27 %
Lymphs Abs: 3 K/uL (ref 0.7–4.0)
MCH: 31.2 pg (ref 26.0–34.0)
MCHC: 31.1 g/dL (ref 30.0–36.0)
MCV: 100.3 fL — ABNORMAL HIGH (ref 80.0–100.0)
Monocytes Absolute: 1 K/uL (ref 0.1–1.0)
Monocytes Relative: 9 %
Neutro Abs: 6.9 K/uL (ref 1.7–7.7)
Neutrophils Relative %: 61 %
Platelets: 291 K/uL (ref 150–400)
RBC: 2.98 MIL/uL — ABNORMAL LOW (ref 3.87–5.11)
RDW: 17.5 % — ABNORMAL HIGH (ref 11.5–15.5)
WBC: 11.3 K/uL — ABNORMAL HIGH (ref 4.0–10.5)
nRBC: 0.3 % — ABNORMAL HIGH (ref 0.0–0.2)

## 2024-05-09 LAB — RENAL FUNCTION PANEL
Albumin: 2.9 g/dL — ABNORMAL LOW (ref 3.5–5.0)
Anion gap: 17 — ABNORMAL HIGH (ref 5–15)
BUN: 19 mg/dL (ref 8–23)
CO2: 20 mmol/L — ABNORMAL LOW (ref 22–32)
Calcium: 8.8 mg/dL — ABNORMAL LOW (ref 8.9–10.3)
Chloride: 101 mmol/L (ref 98–111)
Creatinine, Ser: 6.95 mg/dL — ABNORMAL HIGH (ref 0.44–1.00)
GFR, Estimated: 6 mL/min — ABNORMAL LOW (ref >60)
Glucose, Bld: 76 mg/dL (ref 70–99)
Phosphorus: 3.8 mg/dL (ref 2.5–4.6)
Potassium: 3.7 mmol/L (ref 3.5–5.1)
Sodium: 137 mmol/L (ref 135–145)

## 2024-05-09 LAB — GLUCOSE, CAPILLARY
Glucose-Capillary: 84 mg/dL (ref 70–99)
Glucose-Capillary: 62 mg/dL — ABNORMAL LOW (ref 70–99)
Glucose-Capillary: 75 mg/dL (ref 70–99)
Glucose-Capillary: 89 mg/dL (ref 70–99)
Glucose-Capillary: 72 mg/dL (ref 70–99)
Glucose-Capillary: 96 mg/dL (ref 70–99)
Glucose-Capillary: 69 mg/dL — ABNORMAL LOW (ref 70–99)

## 2024-05-09 LAB — MAGNESIUM: Magnesium: 2.3 mg/dL (ref 1.7–2.4)

## 2024-05-09 MED ORDER — PSYLLIUM 95 % PO PACK
1.0000 | PACK | Freq: Every day | ORAL | Status: DC
  Administered 2024-05-09 – 2024-05-14 (×5): 1 via ORAL
  Filled 2024-05-09 (×8): qty 1

## 2024-05-09 MED ORDER — HEPARIN SODIUM (PORCINE) 1000 UNIT/ML IJ SOLN
INTRAMUSCULAR | Status: AC
Start: 2024-05-09 — End: 2024-05-09
  Filled 2024-05-09: qty 4

## 2024-05-09 MED ORDER — HEPARIN SODIUM (PORCINE) 1000 UNIT/ML IJ SOLN
INTRAMUSCULAR | Status: AC
  Filled 2024-05-09: qty 1

## 2024-05-09 MED ORDER — ALBUMIN HUMAN 25 % IV SOLN
INTRAVENOUS | Status: AC
  Filled 2024-05-09: qty 100

## 2024-05-09 MED ORDER — ALBUMIN HUMAN 25 % IV SOLN
25.0000 g | Freq: Once | INTRAVENOUS | Status: AC
  Administered 2024-05-09: 25 g via INTRAVENOUS

## 2024-05-09 MED ORDER — VANCOMYCIN HCL 750 MG/150ML IV SOLN
750.0000 mg | Freq: Once | INTRAVENOUS | Status: DC
  Filled 2024-05-09: qty 150

## 2024-05-09 MED ORDER — LOPERAMIDE HCL 2 MG PO CAPS
2.0000 mg | ORAL_CAPSULE | ORAL | Status: DC | PRN
  Administered 2024-05-09 – 2024-05-11 (×2): 2 mg via ORAL
  Filled 2024-05-09 (×2): qty 1

## 2024-05-09 NOTE — Progress Notes (Signed)
 [facility]  ROUNDING NOTE   Subjective:   Patient ill appearing resting in bed Partially completed breakfast tray at bedside Room air Denies shortness of breath  Objective:  Vital signs in last 24 hours:  Temp:  [97.4 F (36.3 C)-97.8 F (36.6 C)] 97.4 F (36.3 C) (11/14 0800) Pulse Rate:  [66-100] 100 (11/14 0900) Resp:  [13-32] 16 (11/14 0900) BP: (61-134)/(22-114) 109/41 (11/14 0900) SpO2:  [98 %-100 %] 100 % (11/14 0900) Weight:  [69.5 kg] 69.5 kg (11/14 0500)  Weight change:  Filed Weights   05/06/24 1442 05/09/24 0500  Weight: 70.3 kg 69.5 kg    Intake/Output: I/O last 3 completed shifts: In: 1156.9 [P.O.:720; I.V.:238.4; IV Piggyback:198.5] Out: 350 [Emesis/NG output:350]   Intake/Output this shift:  Total I/O In: 514.9 [P.O.:480; I.V.:34.9] Out: -   Physical Exam: General: NAD, seated in chair, ill apparing  Head: Normocephalic, atraumatic. Moist oral mucosal membranes  Eyes: Anicteric  Lungs:  Clear to auscultation, normal effort  Heart: Regular rate and rhythm  Abdomen:  Soft, nontender  Extremities:  No peripheral edema.  Neurologic: Awake, alert, conversant  Skin: Warm,dry, no rash  Access: Rt internal jugular permcath    Basic Metabolic Panel: Recent Labs  Lab 05/06/24 1509 05/07/24 0552 05/07/24 1418 05/08/24 0451 05/09/24 0651  NA 141 141  --  136 137  K 3.7 3.3*  --  3.4* 3.7  CL 97* 104  --  100 101  CO2 31 27  --  23 20*  GLUCOSE 95 76  --  92 76  BUN 8 9  --  13 19  CREATININE 3.76* 4.75*  --  5.64* 6.95*  CALCIUM 9.3 8.4*  --  8.9 8.8*  MG 1.7  --  1.8  --  2.3  PHOS  --   --  2.0* 3.2 3.8    Liver Function Tests: Recent Labs  Lab 05/06/24 1509 05/07/24 1418 05/09/24 0651  AST 65* 45*  --   ALT 20 19  --   ALKPHOS 84 70  --   BILITOT 0.4 0.3  --   PROT 7.2 5.4*  --   ALBUMIN 4.0 2.9* 2.9*   No results for input(s): LIPASE, AMYLASE in the last 168 hours. No results for input(s): AMMONIA in the  last 168 hours.  CBC: Recent Labs  Lab 05/06/24 1509 05/07/24 0552 05/08/24 0451 05/09/24 0651  WBC 15.7* 18.1* 15.3* 11.3*  NEUTROABS 13.3*  --   --  6.9  HGB 10.9* 8.3* 9.5* 9.3*  HCT 35.7* 27.5* 31.2* 29.9*  MCV 102.3* 104.2* 104.3* 100.3*  PLT 347 255 279 291    Cardiac Enzymes: No results for input(s): CKTOTAL, CKMB, CKMBINDEX, TROPONINI in the last 168 hours.  BNP: Invalid input(s): POCBNP  CBG: Recent Labs  Lab 05/08/24 1921 05/08/24 2322 05/09/24 0431 05/09/24 0740 05/09/24 0803  GLUCAP 111* 90 84 62* 75    Microbiology: Results for orders placed or performed during the hospital encounter of 05/06/24  Blood culture (single)     Status: None (Preliminary result)   Collection Time: 05/06/24 11:05 PM   Specimen: BLOOD  Result Value Ref Range Status   Specimen Description BLOOD BLOOD RIGHT HAND  Final   Special Requests   Final    BOTTLES DRAWN AEROBIC AND ANAEROBIC Blood Culture adequate volume   Culture   Final    NO GROWTH 3 DAYS Performed at [facility], [address], [place], [place] [address]    Report  Status PENDING  Incomplete  MRSA Next Gen by PCR, Nasal     Status: None   Collection Time: 05/07/24  9:37 AM   Specimen: Nasal Mucosa; Nasal Swab  Result Value Ref Range Status   MRSA by PCR Next Gen NOT DETECTED NOT DETECTED Final    Comment: (NOTE) The GeneXpert MRSA Assay (FDA approved for NASAL specimens only), is one component of a comprehensive MRSA colonization surveillance program. It is not intended to diagnose MRSA infection nor to guide or monitor treatment for MRSA infections. Test performance is not FDA approved in patients less than 34 years old. Performed at [facility], [address] [place], [place] [address]   Culture, blood (single) w Reflex to ID Panel     Status: None (Preliminary result)   Collection Time: 05/07/24 11:49 AM   Specimen: BLOOD  Result Value Ref Range Status   Specimen  Description BLOOD BLOOD LEFT HAND  Final   Special Requests   Final    BOTTLES DRAWN AEROBIC AND ANAEROBIC Blood Culture adequate volume   Culture   Final    NO GROWTH 2 DAYS Performed at [facility], [address] [place], [place] [address]    Report Status PENDING  Incomplete  Resp panel by RT-PCR (RSV, Flu A&B, Covid) Anterior Nasal Swab     Status: None   Collection Time: 05/07/24  3:12 PM   Specimen: Anterior Nasal Swab  Result Value Ref Range Status   SARS Coronavirus 2 by RT PCR NEGATIVE NEGATIVE Final    Comment: (NOTE) SARS-CoV-2 target nucleic acids are NOT DETECTED.  The SARS-CoV-2 RNA is generally detectable in upper respiratory specimens during the acute phase of infection. The lowest concentration of SARS-CoV-2 viral copies this assay can detect is 138 copies/mL. A negative result does not preclude SARS-Cov-2 infection and should not be used as the sole basis for treatment or other patient management decisions. A negative result may occur with  improper specimen collection/handling, submission of specimen other than nasopharyngeal swab, presence of viral mutation(s) within the areas targeted by this assay, and inadequate number of viral copies(<138 copies/mL). A negative result must be combined with clinical observations, patient history, and epidemiological information. The expected result is Negative.  Fact Sheet for Patients:  bloggercourse.com  Fact Sheet for Healthcare Providers:  seriousbroker.it  This test is no t yet approved or cleared by the [place]  FDA and  has been authorized for detection and/or diagnosis of SARS-CoV-2 by FDA under an Emergency Use Authorization (EUA). This EUA will remain  in effect (meaning this test can be used) for the duration of the COVID-19 declaration under Section 564(b)(1) of the Act, 21 U.S.C.section 360bbb-3(b)(1), unless the authorization is terminated   or revoked sooner.       Influenza A by PCR NEGATIVE NEGATIVE Final   Influenza B by PCR NEGATIVE NEGATIVE Final    Comment: (NOTE) The Xpert Xpress SARS-CoV-2/FLU/RSV plus assay is intended as an aid in the diagnosis of influenza from Nasopharyngeal swab specimens and should not be used as a sole basis for treatment. Nasal washings and aspirates are unacceptable for Xpert Xpress SARS-CoV-2/FLU/RSV testing.  Fact Sheet for Patients: bloggercourse.com  Fact Sheet for Healthcare Providers: seriousbroker.it  This test is not yet approved or cleared by the [place]  FDA and has been authorized for detection and/or diagnosis of SARS-CoV-2 by FDA under an Emergency Use Authorization (EUA). This EUA will remain in effect (meaning this test can be used) for  the duration of the COVID-19 declaration under Section 564(b)(1) of the Act, 21 U.S.C. section 360bbb-3(b)(1), unless the authorization is terminated or revoked.     Resp Syncytial Virus by PCR NEGATIVE NEGATIVE Final    Comment: (NOTE) Fact Sheet for Patients: bloggercourse.com  Fact Sheet for Healthcare Providers: seriousbroker.it  This test is not yet approved or cleared by the [place]  FDA and has been authorized for detection and/or diagnosis of SARS-CoV-2 by FDA under an Emergency Use Authorization (EUA). This EUA will remain in effect (meaning this test can be used) for the duration of the COVID-19 declaration under Section 564(b)(1) of the Act, 21 U.S.C. section 360bbb-3(b)(1), unless the authorization is terminated or revoked.  Performed at [facility], [address] [place], [place] [address]     Coagulation Studies: Recent Labs    05/07/24 0552  LABPROT 13.3  INR 1.0    Urinalysis: No results for input(s): COLORURINE, LABSPEC, PHURINE, GLUCOSEU, HGBUR, BILIRUBINUR,  KETONESUR, PROTEINUR, UROBILINOGEN, NITRITE, LEUKOCYTESUR in the last 72 hours.  Invalid input(s): APPERANCEUR    Imaging: CT ABDOMEN PELVIS WO CONTRAST Result Date: 05/08/2024 EXAM: CT ABDOMEN AND PELVIS WITHOUT CONTRAST 05/08/2024 09:57:55 AM TECHNIQUE: CT of the abdomen and pelvis was performed without the administration of intravenous contrast. Multiplanar reformatted images are provided for review. Automated exposure control, iterative reconstruction, and/or weight-based adjustment of the mA/kV was utilized to reduce the radiation dose to as low as reasonably achievable. COMPARISON: 07/24/2012 CLINICAL HISTORY: Sepsis FINDINGS: LOWER CHEST: Hemodialysis catheter in place, tip at the cavoatrial junction. Posterior bibasilar dependent atelectasis. LIVER: The liver is unremarkable. GALLBLADDER AND BILE DUCTS: Decompressed gallbladder. No radiopaque gallstones. No biliary ductal dilatation. SPLEEN: No acute abnormality. PANCREAS: No acute abnormality. ADRENAL GLANDS: No acute abnormality. KIDNEYS, URETERS AND BLADDER: Multiple bilateral renal cysts. The largest measures 2.5 cm in the right interpolar region, which is likely a proteinaceous cyst. No stones in the kidneys or ureters. No hydronephrosis. No perinephric or periureteral stranding. Urinary bladder is unremarkable. GI AND BOWEL: Stomach demonstrates no acute abnormality. The appendix was not visualized. No pericecal or right lower quadrant inflammatory stranding to suggest acute appendicitis. There is no bowel obstruction. PERITONEUM AND RETROPERITONEUM: No ascites. No free air. VASCULATURE: Aorta is normal in caliber. LYMPH NODES: No lymphadenopathy. REPRODUCTIVE ORGANS: Age-related atrophy of the uterus and ovaries. No free pelvic fluid. BONES AND SOFT TISSUES: Osteopenia. Multilevel degenerative disc disease of the spine. Minimal grade 1 anterolisthesis of L4 on L5. Bony erosions along both SI joints bilaterally, likely due to  hyperparathyroidism related to end-stage renal disease. Multiple small soft tissue nodules within the ventral abdominal wall with areas of subcutaneous gas, likely reflecting changes of subcutaneous injection. In the left upper quadrant, there is a focal region of encapsulated, inflamed fat measuring 2.5 x 4.4 cm (axial 35). Additionally, there is a similar region of inflamed fat along the left periumbilical ventral abdominal wall measuring 2.8 x 4.7 cm (axial 56), both likely reflecting fat necrosis. Small ovoid fluid collection in the left lower quadrant abdominal wall measuring 2.4 x 1.7 cm (axial 55), possibly related to subcutaneous injection for an evolving hematoma. IMPRESSION: 1. No acute findings in the abdomen or pelvis. 2. Small ovoid fluid collection in the left lower quadrant abdominal wall measuring 2.4 x 1.7 cm (axial 55), possibly related to subcutaneous injection or an evolving hematoma. Electronically signed by: Rogelia Myers MD 05/08/2024 10:58 AM EST RP Workstation: [id number]   US  Abdomen Limited Result Date: 05/07/2024 CLINICAL DATA:  73 year old female currently admitted with sepsis thought to be related to possible bacterial peritonitis. Request for diagnostic paracentesis. EXAM: ULTRASOUND ABDOMEN LIMITED COMPARISON:  None FINDINGS: Limited ultrasound done of all 4 quadrants. There is no ascites present. There is no pocket of fluid large enough to allow for safe approach for paracentesis. IMPRESSION: No ascites visible on ultrasound to allow for safe approach for paracentesis. Ultrasound performed by: Sherrilee Bal, PA-C Electronically Signed   By: Cordella Banner   On: 05/07/2024 13:17     Medications:    meropenem (MERREM) IV 500 mg (05/08/24 2218)   vancomycin      atorvastatin  20 mg Oral Daily   Chlorhexidine Gluconate Cloth  6 each Topical Q0600   cinacalcet  60 mg Oral Q breakfast   feeding supplement (NEPRO CARB STEADY)  237 mL Oral BID BM   fludrocortisone  0.1  mg Oral Daily   fluticasone furoate-vilanterol  1 puff Inhalation Daily   gabapentin  300 mg Oral BID   heparin  5,000 Units Subcutaneous Q8H   midodrine  20 mg Oral TID WC   multivitamin  1 tablet Oral QHS   pantoprazole (PROTONIX) IV  40 mg Intravenous Q24H   acetaminophen **OR** acetaminophen, HYDROcodone-acetaminophen, ondansetron **OR** ondansetron (ZOFRAN) IV  Assessment/ Plan:  Ms. Cynthia Harvey is a 73 y.o.  female  with past medical history of SLE, pAfib, chronic hypotension and end stage renal disease on hemodialysis, who was admitted to [facility] on 05/06/2024 for Hypoglycemia [E16.2] Hypotension [I95.9] Sepsis (HCC) [A41.9] Hypotension, unspecified hypotension type [I95.9]   End-stage renal disease on hemodialysis. Dialysis held yesterday due to hypotension.  Blood pressure remains soft but stable today.  Patient currently prescribed midodrine 20 mg 3 times daily.  Will provide dialysis treatment with no UF.  Next treatment scheduled for Monday.  2. Anemia of chronic kidney disease Lab Results  Component Value Date   HGB 9.3 (L) 05/09/2024  Patient receives Mircera at outpatient clinic. Will order low-dose Retacrit with today's dialysis.   3. Secondary Hyperparathyroidism: with outpatient labs: PTH 366, phosphorus 4.5, calcium 8.6 on 11//25    Lab Results  Component Value Date   CALCIUM 8.8 (L) 05/09/2024   PHOS 3.8 05/09/2024    Patient prescribed calcitriol, cholecalciferol, Sensipar, and sevelamer outpatient. Continue Sensipar.  Albumin decreased, 2.9.  Will give 25 g with dialysis.  4. Hypotension likely secondary to sepsis. Suspected bacterial peritonitis . Recent PD catheter removal. Elevated white count and lactic acid. Primary team has ordered meropenem and vancomycin.  Prescribed midodrine as above   LOS: 2 Naw Lasala 11/14/202510:59 AM

## 2024-05-09 NOTE — Progress Notes (Signed)
 Hemodialysis Note:  Received patient in bed to unit. Alert and oriented. Informed consent singed and in chart.  Treatment initiated: 1336 Treatment completed: 1717  Access used: Right Subclavian catheter Access issues: None  Patient tolerated well. Transported back to room, alert without acute distress. Report given to patient's RN.  Total UF removed: 0 Medications given: Albumin 25 gm IV  Post HD weight: 71.6 Kg  [facility]

## 2024-05-09 NOTE — Progress Notes (Signed)
 PROGRESS NOTE    Cynthia Harvey  FMW:[id number] DOB: Jun 20, 1951 DOA: 05/06/2024 PCP: Henry Fitch, MD    Brief Narrative:  This is a 73 yo female via EMS from [facility] who presented to [facility] ER on 11/12 following a dialysis treatment she developed hypotension when she got back to [facility].  At the facility she received her scheduled fludrocortisone and midodrine 20 mg.  She was also found to be hypoglycemic CBG 54, therefore she was instructed to drink 3 cups of orange juice.  However, she was unable to drink all of it because she began to vomit.     She was recently transitioned from PD to HD during hospitalization at [facility] on 04/05/2024 to 04/21/2024 via a right tunneled catheter placed on 04/09/24.  However, she later required right tunneled hemodialysis catheter exchange on 04/23/2024 due to malfunctioning catheter.   11/11: Admitted to the progressive care unit but remained in the ER pending bed availability with sepsis suspected secondary to bacterial peritonitis  11/12: Pt developed hypotension requiring peripheral levophed gtt and transfer to ICU PCCM assumed care.  Abxs broadened to meropenum and vancomycin.  IR consulted for paracentesis, however no ascites visible on ultrasound to allow for safe approach for paracentesis  11/13: Pt remains on levophed gtt @3  mcg/min to maintain map >65.  Pt c/o nausea CT Abd/Pelvis pending  11/14: No longer on vasopressors.  MAP goal reduced to 60.  SBP goal reduced to 80.  Nausea improved.   Assessment & Plan:   Principal Problem:   Septic shock (HCC) Active Problems:   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(10/11-11/3/25)   Hypotension, acute on chronic   Hypoglycemia   ESRD on hemodialysis (HCC)   History of anemia due to chronic kidney disease   Systemic lupus erythematosus (HCC)  #Circulatory shock  #Elevated troponin suspect secondary to demand ischemia  Hx: Chronic  hypotension, and atrial fibrillation  Suspected septic shock.  Shock physiology resolved.  Briefly required Levophed gtt.  Now weaned off. Patient has chronic hypotension on midodrine and fludrocortisone Plan: Keep stepdown status for now Midodrine and fludrocortisone Telemetry monitoring Empiric antibiotics as below   #ESRD on HD  #Hypokalemia  Nephrology following.  Dialysis plans affected by hypotension. Plan: HD today   #Concern for possible SBP  #Nausea/vomiting   Nausea vomiting improved.  Pretreating with Zofran prior to meals.  Unable to exclude SBP at this time.  IR unable to perform paracentesis.  No visible ascites on ultrasound No acute findings in abdomen or pelvis on CT.  KUB negative. Plan: Continue broad-spectrum IV antibiotics vancomycin and meropenem for now.  Consider de-escalation/discontinuation within the next 1 to 2 days.  Follow cultures.  Trend WBC.   #Systemic Lupus Erythematosus  Outpatient hydroxychloroquine on hold for now.  Consider restarting within 1 to 2 days   #Anemia without signs of active bleeding  Currently no indication for transfusion.  Hemoglobin stable.   #Hypoglycemia  Continue regular CBGs.  Soft diet as tolerated.   DVT prophylaxis: SQH Code Status: Full Family Communication: None Disposition Plan: Status is: Inpatient Remains inpatient appropriate because: Multiple acute issues as above   Level of care: Stepdown  Consultants:  Nephrology  Procedures:  None  Antimicrobials: Vancomycin Meropenem   Subjective: Seen and examined.  Reports improvement in nausea and vomiting since prior.  Objective: Vitals:   05/09/24 1000 05/09/24 1100 05/09/24 1200 05/09/24 1300  BP: (!) 143/131 (!) 95/45 (!) 93/58 99/87  Pulse: ROLLEN)  102 86  86  Resp: (!) 25 20 12 18   Temp:      TempSrc:      SpO2: 100% 100% 100% 100%  Weight:      Height:        Intake/Output Summary (Last 24 hours) at 05/09/2024 1313 Last data filed at  05/09/2024 1200 Gross per 24 hour  Intake 1385.57 ml  Output --  Net 1385.57 ml   Filed Weights   05/06/24 1442 05/09/24 0500  Weight: 70.3 kg 69.5 kg    Examination:  General exam: Appears fatigued Respiratory system: Lungs clear.  No work of breathing.  2 L Cardiovascular system: S1-S2, RRR, no murmurs, no pedal edema Gastrointestinal system: Soft, T/ND, normal bowel sounds Central nervous system: Alert and oriented. No focal neurological deficits. Extremities: Decreased power bilaterally Skin: No rashes, lesions or ulcers Psychiatry: Judgement and insight appear normal. Mood & affect appropriate.     Data Reviewed: I have personally reviewed following labs and imaging studies  CBC: Recent Labs  Lab 05/06/24 1509 05/07/24 0552 05/08/24 0451 05/09/24 0651  WBC 15.7* 18.1* 15.3* 11.3*  NEUTROABS 13.3*  --   --  6.9  HGB 10.9* 8.3* 9.5* 9.3*  HCT 35.7* 27.5* 31.2* 29.9*  MCV 102.3* 104.2* 104.3* 100.3*  PLT 347 255 279 291   Basic Metabolic Panel: Recent Labs  Lab 05/06/24 1509 05/07/24 0552 05/07/24 1418 05/08/24 0451 05/09/24 0651  NA 141 141  --  136 137  K 3.7 3.3*  --  3.4* 3.7  CL 97* 104  --  100 101  CO2 31 27  --  23 20*  GLUCOSE 95 76  --  92 76  BUN 8 9  --  13 19  CREATININE 3.76* 4.75*  --  5.64* 6.95*  CALCIUM 9.3 8.4*  --  8.9 8.8*  MG 1.7  --  1.8  --  2.3  PHOS  --   --  2.0* 3.2 3.8   GFR: Estimated Creatinine Clearance: 6 mL/min (A) (by C-G formula based on SCr of 6.95 mg/dL (H)). Liver Function Tests: Recent Labs  Lab 05/06/24 1509 05/07/24 1418 05/09/24 0651  AST 65* 45*  --   ALT 20 19  --   ALKPHOS 84 70  --   BILITOT 0.4 0.3  --   PROT 7.2 5.4*  --   ALBUMIN 4.0 2.9* 2.9*   No results for input(s): LIPASE, AMYLASE in the last 168 hours. No results for input(s): AMMONIA in the last 168 hours. Coagulation Profile: Recent Labs  Lab 05/07/24 0552  INR 1.0   Cardiac Enzymes: No results for input(s): CKTOTAL,  CKMB, CKMBINDEX, TROPONINI in the last 168 hours. BNP (last 3 results) No results for input(s): PROBNP in the last 8760 hours. HbA1C: No results for input(s): HGBA1C in the last 72 hours. CBG: Recent Labs  Lab 05/08/24 2322 05/09/24 0431 05/09/24 0740 05/09/24 0803 05/09/24 1127  GLUCAP 90 84 62* 75 89   Lipid Profile: No results for input(s): CHOL, HDL, LDLCALC, TRIG, CHOLHDL, LDLDIRECT in the last 72 hours. Thyroid  Function Tests: No results for input(s): TSH, T4TOTAL, FREET4, T3FREE, THYROIDAB in the last 72 hours. Anemia Panel: No results for input(s): VITAMINB12, FOLATE, FERRITIN, TIBC, IRON, RETICCTPCT in the last 72 hours. Sepsis Labs: Recent Labs  Lab 05/06/24 1731 05/06/24 1943 05/07/24 0845 05/07/24 1418  PROCALCITON  --   --   --  1.16  LATICACIDVEN 2.9* 3.5* 1.0  --  Recent Results (from the past 240 hours)  Blood culture (single)     Status: None (Preliminary result)   Collection Time: 05/06/24 11:05 PM   Specimen: BLOOD  Result Value Ref Range Status   Specimen Description BLOOD BLOOD RIGHT HAND  Final   Special Requests   Final    BOTTLES DRAWN AEROBIC AND ANAEROBIC Blood Culture adequate volume   Culture   Final    NO GROWTH 3 DAYS Performed at [facility], [address], [place], [place] [address]    Report Status PENDING  Incomplete  MRSA Next Gen by PCR, Nasal     Status: None   Collection Time: 05/07/24  9:37 AM   Specimen: Nasal Mucosa; Nasal Swab  Result Value Ref Range Status   MRSA by PCR Next Gen NOT DETECTED NOT DETECTED Final    Comment: (NOTE) The GeneXpert MRSA Assay (FDA approved for NASAL specimens only), is one component of a comprehensive MRSA colonization surveillance program. It is not intended to diagnose MRSA infection nor to guide or monitor treatment for MRSA infections. Test performance is not FDA approved in patients less than 27 years old. Performed at [facility], [address] [place], [place] [address]   Culture, blood (single) w Reflex to ID Panel     Status: None (Preliminary result)   Collection Time: 05/07/24 11:49 AM   Specimen: BLOOD  Result Value Ref Range Status   Specimen Description BLOOD BLOOD LEFT HAND  Final   Special Requests   Final    BOTTLES DRAWN AEROBIC AND ANAEROBIC Blood Culture adequate volume   Culture   Final    NO GROWTH 2 DAYS Performed at [facility], [address], [place], [place] [address]    Report Status PENDING  Incomplete  Resp panel by RT-PCR (RSV, Flu A&B, Covid) Anterior Nasal Swab     Status: None   Collection Time: 05/07/24  3:12 PM   Specimen: Anterior Nasal Swab  Result Value Ref Range Status   SARS Coronavirus 2 by RT PCR NEGATIVE NEGATIVE Final    Comment: (NOTE) SARS-CoV-2 target nucleic acids are NOT DETECTED.  The SARS-CoV-2 RNA is generally detectable in upper respiratory specimens during the acute phase of infection. The lowest concentration of SARS-CoV-2 viral copies this assay can detect is 138 copies/mL. A negative result does not preclude SARS-Cov-2 infection and should not be used as the sole basis for treatment or other patient management decisions. A negative result may occur with  improper specimen collection/handling, submission of specimen other than nasopharyngeal swab, presence of viral mutation(s) within the areas targeted by this assay, and inadequate number of viral copies(<138 copies/mL). A negative result must be combined with clinical observations, patient history, and epidemiological information. The expected result is Negative.  Fact Sheet for Patients:  bloggercourse.com  Fact Sheet for Healthcare Providers:  seriousbroker.it  This test is no t yet approved or cleared by the [place]  FDA and  has been authorized for detection and/or diagnosis of SARS-CoV-2 by FDA under an Emergency  Use Authorization (EUA). This EUA will remain  in effect (meaning this test can be used) for the duration of the COVID-19 declaration under Section 564(b)(1) of the Act, 21 U.S.C.section 360bbb-3(b)(1), unless the authorization is terminated  or revoked sooner.       Influenza A by PCR NEGATIVE NEGATIVE Final   Influenza B by PCR NEGATIVE NEGATIVE Final    Comment: (NOTE) The Xpert Xpress SARS-CoV-2/FLU/RSV plus assay  is intended as an aid in the diagnosis of influenza from Nasopharyngeal swab specimens and should not be used as a sole basis for treatment. Nasal washings and aspirates are unacceptable for Xpert Xpress SARS-CoV-2/FLU/RSV testing.  Fact Sheet for Patients: bloggercourse.com  Fact Sheet for Healthcare Providers: seriousbroker.it  This test is not yet approved or cleared by the [place]  FDA and has been authorized for detection and/or diagnosis of SARS-CoV-2 by FDA under an Emergency Use Authorization (EUA). This EUA will remain in effect (meaning this test can be used) for the duration of the COVID-19 declaration under Section 564(b)(1) of the Act, 21 U.S.C. section 360bbb-3(b)(1), unless the authorization is terminated or revoked.     Resp Syncytial Virus by PCR NEGATIVE NEGATIVE Final    Comment: (NOTE) Fact Sheet for Patients: bloggercourse.com  Fact Sheet for Healthcare Providers: seriousbroker.it  This test is not yet approved or cleared by the [place]  FDA and has been authorized for detection and/or diagnosis of SARS-CoV-2 by FDA under an Emergency Use Authorization (EUA). This EUA will remain in effect (meaning this test can be used) for the duration of the COVID-19 declaration under Section 564(b)(1) of the Act, 21 U.S.C. section 360bbb-3(b)(1), unless the authorization is terminated or revoked.  Performed at [facility], [address] [place], [place] [address]          Radiology Studies: CT ABDOMEN PELVIS WO CONTRAST Result Date: 05/08/2024 EXAM: CT ABDOMEN AND PELVIS WITHOUT CONTRAST 05/08/2024 09:57:55 AM TECHNIQUE: CT of the abdomen and pelvis was performed without the administration of intravenous contrast. Multiplanar reformatted images are provided for review. Automated exposure control, iterative reconstruction, and/or weight-based adjustment of the mA/kV was utilized to reduce the radiation dose to as low as reasonably achievable. COMPARISON: 07/24/2012 CLINICAL HISTORY: Sepsis FINDINGS: LOWER CHEST: Hemodialysis catheter in place, tip at the cavoatrial junction. Posterior bibasilar dependent atelectasis. LIVER: The liver is unremarkable. GALLBLADDER AND BILE DUCTS: Decompressed gallbladder. No radiopaque gallstones. No biliary ductal dilatation. SPLEEN: No acute abnormality. PANCREAS: No acute abnormality. ADRENAL GLANDS: No acute abnormality. KIDNEYS, URETERS AND BLADDER: Multiple bilateral renal cysts. The largest measures 2.5 cm in the right interpolar region, which is likely a proteinaceous cyst. No stones in the kidneys or ureters. No hydronephrosis. No perinephric or periureteral stranding. Urinary bladder is unremarkable. GI AND BOWEL: Stomach demonstrates no acute abnormality. The appendix was not visualized. No pericecal or right lower quadrant inflammatory stranding to suggest acute appendicitis. There is no bowel obstruction. PERITONEUM AND RETROPERITONEUM: No ascites. No free air. VASCULATURE: Aorta is normal in caliber. LYMPH NODES: No lymphadenopathy. REPRODUCTIVE ORGANS: Age-related atrophy of the uterus and ovaries. No free pelvic fluid. BONES AND SOFT TISSUES: Osteopenia. Multilevel degenerative disc disease of the spine. Minimal grade 1 anterolisthesis of L4 on L5. 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 Multiple small  soft tissue nodules within the ventral abdominal wall with areas of subcutaneous gas, likely reflecting changes of subcutaneous injection. In the left upper quadrant, there is a focal region of encapsulated, inflamed fat measuring 2.5 x 4.4 cm (axial 35). Additionally, there is a similar region of inflamed fat along the left periumbilical ventral abdominal wall measuring 2.8 x 4.7 cm (axial 56), both likely reflecting fat necrosis. Small ovoid fluid collection in the left lower quadrant abdominal wall measuring 2.4 x 1.7 cm (axial 55), possibly related to subcutaneous injection for an evolving hematoma. IMPRESSION: 1. No acute findings in the abdomen  or pelvis. 2. Small ovoid fluid collection in the left lower quadrant abdominal wall measuring 2.4 x 1.7 cm (axial 55), possibly related to subcutaneous injection or an evolving hematoma. Electronically signed by: Rogelia Myers MD 05/08/2024 10:58 AM EST RP Workstation: [id number]        Scheduled Meds:  atorvastatin  20 mg Oral Daily   Chlorhexidine Gluconate Cloth  6 each Topical Q0600   cinacalcet  60 mg Oral Q breakfast   feeding supplement (NEPRO CARB STEADY)  237 mL Oral BID BM   fludrocortisone  0.1 mg Oral Daily   fluticasone furoate-vilanterol  1 puff Inhalation Daily   gabapentin  300 mg Oral BID   heparin  5,000 Units Subcutaneous Q8H   midodrine  20 mg Oral TID WC   multivitamin  1 tablet Oral QHS   pantoprazole (PROTONIX) IV  40 mg Intravenous Q24H   psyllium  1 packet Oral Daily   Continuous Infusions:  albumin human     meropenem (MERREM) IV 500 mg (05/08/24 2218)   vancomycin       LOS: 2 days     Calvin KATHEE Robson, MD [facility]   If 7PM-7AM, please contact night-coverage  05/09/2024, 1:13 PM

## 2024-05-09 NOTE — Plan of Care (Signed)
  Problem: Fluid Volume: Goal: Hemodynamic stability will improve Outcome: Not Progressing   Problem: Clinical Measurements: Goal: Diagnostic test results will improve Outcome: Progressing Goal: Signs and symptoms of infection will decrease Outcome: Progressing   Problem: Clinical Measurements: Goal: Signs and symptoms of infection will decrease Outcome: Progressing   Problem: Education: Goal: Knowledge of General Education information will improve Description: Including pain rating scale, medication(s)/side effects and non-pharmacologic comfort measures Outcome: Progressing   Problem: Pain Managment: Goal: General experience of comfort will improve and/or be controlled Outcome: Progressing

## 2024-05-09 NOTE — Plan of Care (Signed)

## 2024-05-09 NOTE — Progress Notes (Signed)
 Sreenath MD gave permission for patient to travel to HD without RN. See Nursing order.

## 2024-05-10 DIAGNOSIS — Z992 Dependence on renal dialysis: Secondary | ICD-10-CM | POA: Diagnosis not present

## 2024-05-10 DIAGNOSIS — N186 End stage renal disease: Secondary | ICD-10-CM | POA: Diagnosis not present

## 2024-05-10 DIAGNOSIS — R6521 Severe sepsis with septic shock: Secondary | ICD-10-CM | POA: Diagnosis not present

## 2024-05-10 DIAGNOSIS — A419 Sepsis, unspecified organism: Secondary | ICD-10-CM | POA: Diagnosis not present

## 2024-05-10 LAB — BASIC METABOLIC PANEL WITH GFR
Anion gap: 9 (ref 5–15)
Anion gap: 11 (ref 5–15)
BUN: 7 mg/dL — ABNORMAL LOW (ref 8–23)
BUN: 7 mg/dL — ABNORMAL LOW (ref 8–23)
CO2: 23 mmol/L (ref 22–32)
CO2: 24 mmol/L (ref 22–32)
Calcium: 7.5 mg/dL — ABNORMAL LOW (ref 8.9–10.3)
Calcium: 7.8 mg/dL — ABNORMAL LOW (ref 8.9–10.3)
Chloride: 102 mmol/L (ref 98–111)
Chloride: 99 mmol/L (ref 98–111)
Creatinine, Ser: 3.36 mg/dL — ABNORMAL HIGH (ref 0.44–1.00)
Creatinine, Ser: 3.73 mg/dL — ABNORMAL HIGH (ref 0.44–1.00)
GFR, Estimated: 14 mL/min — ABNORMAL LOW (ref >60)
GFR, Estimated: 12 mL/min — ABNORMAL LOW (ref >60)
Glucose, Bld: 68 mg/dL — ABNORMAL LOW (ref 70–99)
Glucose, Bld: 106 mg/dL — ABNORMAL HIGH (ref 70–99)
Potassium: 3.2 mmol/L — ABNORMAL LOW (ref 3.5–5.1)
Potassium: 3.3 mmol/L — ABNORMAL LOW (ref 3.5–5.1)
Sodium: 134 mmol/L — ABNORMAL LOW (ref 135–145)
Sodium: 134 mmol/L — ABNORMAL LOW (ref 135–145)

## 2024-05-10 LAB — GLUCOSE, CAPILLARY
Glucose-Capillary: 100 mg/dL — ABNORMAL HIGH (ref 70–99)
Glucose-Capillary: 121 mg/dL — ABNORMAL HIGH (ref 70–99)
Glucose-Capillary: 123 mg/dL — ABNORMAL HIGH (ref 70–99)
Glucose-Capillary: 53 mg/dL — ABNORMAL LOW (ref 70–99)
Glucose-Capillary: 57 mg/dL — ABNORMAL LOW (ref 70–99)
Glucose-Capillary: 60 mg/dL — ABNORMAL LOW (ref 70–99)
Glucose-Capillary: 87 mg/dL (ref 70–99)
Glucose-Capillary: 91 mg/dL (ref 70–99)
Glucose-Capillary: 92 mg/dL (ref 70–99)

## 2024-05-10 LAB — LACTIC ACID, PLASMA: Lactic Acid, Venous: 2.9 mmol/L (ref 0.5–1.9)

## 2024-05-10 LAB — MAGNESIUM: Magnesium: 1.8 mg/dL (ref 1.7–2.4)

## 2024-05-10 MED ORDER — NOREPINEPHRINE 4 MG/250ML-% IV SOLN
0.0000 ug/min | INTRAVENOUS | Status: DC
  Administered 2024-05-10: 2 ug/min via INTRAVENOUS
  Filled 2024-05-10: qty 250

## 2024-05-10 MED ORDER — SODIUM CHLORIDE 0.9 % IV SOLN: 250.0000 mL | INTRAVENOUS | Status: AC

## 2024-05-10 MED ORDER — DEXTROSE 50 % IV SOLN
25.0000 mL | Freq: Once | INTRAVENOUS | Status: AC
  Filled 2024-05-10: qty 50

## 2024-05-10 MED ORDER — POTASSIUM CHLORIDE 10 MEQ/100ML IV SOLN
10.0000 meq | INTRAVENOUS | Status: AC
Start: 2024-05-10 — End: 2024-05-11
  Administered 2024-05-10 (×2): 10 meq via INTRAVENOUS
  Filled 2024-05-10 (×2): qty 100

## 2024-05-10 MED ORDER — ALBUMIN HUMAN 25 % IV SOLN: 25.0000 g | Freq: Once | INTRAVENOUS | Status: AC

## 2024-05-10 MED ORDER — NOREPINEPHRINE 4 MG/250ML-% IV SOLN
0.0000 ug/min | INTRAVENOUS | Status: DC
  Administered 2024-05-10: 2 ug/min via INTRAVENOUS

## 2024-05-10 MED ORDER — DEXTROSE 50 % IV SOLN: 1.0000 | INTRAVENOUS | Status: DC | PRN

## 2024-05-10 MED ORDER — SODIUM CHLORIDE 0.9 % IV BOLUS: 250.0000 mL | Freq: Once | INTRAVENOUS | Status: DC

## 2024-05-10 MED ORDER — DEXTROSE 50 % IV SOLN
INTRAVENOUS | Status: AC
  Administered 2024-05-10: 25 mL via INTRAVENOUS
  Filled 2024-05-10: qty 50

## 2024-05-10 MED ORDER — ALBUMIN HUMAN 25 % IV SOLN
25.0000 g | Freq: Once | INTRAVENOUS | Status: AC
  Administered 2024-05-10: 25 g via INTRAVENOUS
  Filled 2024-05-10: qty 100

## 2024-05-10 MED ORDER — HYDROCORTISONE SOD SUC (PF) 100 MG IJ SOLR
100.0000 mg | Freq: Three times a day (TID) | INTRAMUSCULAR | Status: DC
  Administered 2024-05-10 – 2024-05-12 (×7): 100 mg via INTRAVENOUS
  Filled 2024-05-10 (×7): qty 2

## 2024-05-10 NOTE — Progress Notes (Signed)
 Brief progress note  MAPs declined overnight A line placed per ICU APP MAPs this AM in 50s on a line  Plan: Case d/w ICU attending ICU to assume primary care Albumin bolus x 1 Peripheral norepi Escalate level of care to ICU  TRH service to sign off at this time Please contact our service when patient stable to transfer back to [facility]  Calvin Robson MD

## 2024-05-10 NOTE — Progress Notes (Signed)
 Received consult to place PIV. Attempted x1 in RAC without success. Assessed bilateral upper and lower arms with and without US . No appropriate veins found. Primary RN at bedside and aware. States will inform primary team.

## 2024-05-10 NOTE — Progress Notes (Signed)
 [facility]  PROGRESS NOTE   Subjective:   Patient seen in the ICU.  Feels much better this morning.  Objective:  Vital signs: Blood pressure (!) 124/54, pulse 89, temperature 98.6 F (37 C), temperature source Axillary, resp. rate 13, height 4' 9 (1.448 m), weight 72.6 kg, SpO2 100%.  Intake/Output Summary (Last 24 hours) at 05/10/2024 1218 Last data filed at 05/10/2024 0600 Gross per 24 hour  Intake 349.07 ml  Output 0 ml  Net 349.07 ml   Filed Weights   05/09/24 1318 05/09/24 1717 05/10/24 0447  Weight: 71.6 kg 71.6 kg 72.6 kg     Physical Exam: General:  No acute distress  Head:  Normocephalic, atraumatic. Moist oral mucosal membranes  Eyes:  Anicteric  Neck:  Supple  Lungs:   Clear to auscultation, normal effort  Heart:  S1S2 no rubs  Abdomen:   Soft, nontender, bowel sounds present  Extremities:  peripheral edema.  Neurologic:  Awake, alert, following commands  Skin:  No lesions  Access:     Basic Metabolic Panel: Recent Labs  Lab 05/06/24 1509 05/07/24 0552 05/07/24 1418 05/08/24 0451 05/09/24 0651 05/10/24 0148 05/10/24 0434  NA 141 141  --  136 137 134* 134*  K 3.7 3.3*  --  3.4* 3.7 3.2* 3.3*  CL 97* 104  --  100 101 102 99  CO2 31 27  --  23 20* 23 24  GLUCOSE 95 76  --  92 76 68* 106*  BUN 8 9  --  13 19 7* 7*  CREATININE 3.76* 4.75*  --  5.64* 6.95* 3.36* 3.73*  CALCIUM 9.3 8.4*  --  8.9 8.8* 7.5* 7.8*  MG 1.7  --  1.8  --  2.3 1.8  --   PHOS  --   --  2.0* 3.2 3.8  --   --    GFR: Estimated Creatinine Clearance: 11.4 mL/min (A) (by C-G formula based on SCr of 3.73 mg/dL (H)).  Liver Function Tests: Recent Labs  Lab 05/06/24 1509 05/07/24 1418 05/09/24 0651  AST 65* 45*  --   ALT 20 19  --   ALKPHOS 84 70  --   BILITOT 0.4 0.3  --   PROT 7.2 5.4*  --   ALBUMIN 4.0 2.9* 2.9*   No results for input(s): LIPASE, AMYLASE in the last 168 hours. No results for input(s): AMMONIA in the last 168  hours.  CBC: Recent Labs  Lab 05/06/24 1509 05/07/24 0552 05/08/24 0451 05/09/24 0651  WBC 15.7* 18.1* 15.3* 11.3*  NEUTROABS 13.3*  --   --  6.9  HGB 10.9* 8.3* 9.5* 9.3*  HCT 35.7* 27.5* 31.2* 29.9*  MCV 102.3* 104.2* 104.3* 100.3*  PLT 347 255 279 291     HbA1C: No results found for: HGBA1C  Urinalysis: No results for input(s): COLORURINE, LABSPEC, PHURINE, GLUCOSEU, HGBUR, BILIRUBINUR, KETONESUR, PROTEINUR, UROBILINOGEN, NITRITE, LEUKOCYTESUR in the last 72 hours.  Invalid input(s): APPERANCEUR    Imaging: No results found.   Medications:    sodium chloride Stopped (05/10/24 0953)   meropenem (MERREM) IV Stopped (05/09/24 2203)   norepinephrine (LEVOPHED) Adult infusion 2 mcg/min (05/10/24 1208)    atorvastatin  20 mg Oral Daily   Chlorhexidine Gluconate Cloth  6 each Topical Q0600   cinacalcet  60 mg Oral Q breakfast   feeding supplement (NEPRO CARB STEADY)  237 mL Oral BID BM   fludrocortisone  0.1 mg Oral Daily   fluticasone furoate-vilanterol  1 puff  Inhalation Daily   gabapentin  300 mg Oral BID   heparin  5,000 Units Subcutaneous Q8H   hydrocortisone sod succinate (SOLU-CORTEF) inj  100 mg Intravenous Q8H   midodrine  20 mg Oral TID WC   multivitamin  1 tablet Oral QHS   pantoprazole (PROTONIX) IV  40 mg Intravenous Q24H   psyllium  1 packet Oral Daily    Assessment/ Plan:      73 y.o.  female  with past medical history of SLE, pAfib, chronic hypotension and end stage renal disease on hemodialysis now admitted with history of hypoglycemia and also was found to have sepsis and hypotension.  She was started on IV antibiotics and also Levophed.  she recently had peritoneal dialysis catheter removed.  She was dialyzed last night.  #1: End-stage renal disease: Patient was dialyzed yesterday.  No fluid removal was done.  She was given albumin yesterday  2: Anemia: Continue anemia protocols with IV iron and also  erythropoietin.  #3: Hypotension and sepsis: Patient is being treated for septic shock.  She was given vancomycin and meropenem. Will continue the pressors and wean as tolerated.  #4: Secondary hyperparathyroidism: Continue to follow PTH, calcium and phosphorus levels.  Continue Sensipar.  Labs and medications reviewed. Will continue to follow along with you.   LOS: 3 Pinkey Edman, MD [facility] 11/15/202512:18 PM

## 2024-05-10 NOTE — Progress Notes (Addendum)
 Pt has been barely meeting blood pressure goal throughout the shift. Blood pressures continued to drop. Most recents in the 60/40s.  Dr. Lawence notified. Fluid bolus and levo ordered with plans to transition patient back under icu level of care. Per NP Almarie, use levo over fluid bolus due to pt receiving dialysis on 11/14.  Mansy at bedside to assess patient. Levo started and blood pressures improved.  Goal to place arterial line per NP Almarie.

## 2024-05-10 NOTE — Progress Notes (Signed)
 NAME:  Cynthia Harvey, MRN:  [id number], DOB:  09/14/1950, LOS: 3 ADMISSION DATE:  05/06/2024, CONSULTATION DATE:  05/10/24 REFERRING MD:  , CHIEF COMPLAINT:  Hypotension, septic shock due to peritonitis  History of Present Illness:  Patient is a 73 year old female with history of ESRD who recently transition from dialysis via peritoneal route to hemodialysis due to pseudomonas peritonitis associated with her PD catheter (now removed).  She presented to the emergency department on 11/12 following a session of dialysis where she developed hypotension acutely. PCCM was for recurrent hypotension requiring vasopressor.  Patient has resumed dialysis as of 11/14 although no ultra filtrate was removed during that session.  Patient remains on a 1200 mL fluid restriction.  She has been maintained on midodrine and Florinef as an outpatient which was continued upon admission she has been transferred to [facility] service for close monitoring.  Pertinent  Medical History  ESRD on hemodialysis Tuesday/Thursday/Saturday via right IJ permacath   Significant Hospital Events: Including procedures, antibiotic start and stop dates in addition to other pertinent events   11/12: Admit to hospitalist service for hypotension during HD session 11/14: Weaned off of Levophed after 250 mL liter NS bolus 11/15: Transfer to PCCM service, currently not requiring Levophed.  Interim History / Subjective:  Patient voices disappointment in hospital course, remaining admitted  Objective    Blood pressure (!) 124/54, pulse 79, temperature 98.7 F (37.1 C), temperature source Oral, resp. rate 14, height 4' 9 (1.448 m), weight 72.6 kg, SpO2 100%.        Intake/Output Summary (Last 24 hours) at 05/10/2024 1022 Last data filed at 05/10/2024 0600 Gross per 24 hour  Intake 549.07 ml  Output 0 ml  Net 549.07 ml   Filed Weights   05/09/24 1318 05/09/24 1717 05/10/24 0447  Weight: 71.6 kg 71.6 kg 72.6 kg     Examination: General: NAD, supine in bed HENT: OP clear, PERRL, no lymphadenopathy Lungs: Clear to auscultation, respirations easy on room air Cardiovascular: RRR, no murmur.  Tunneled right IJ permacath, site benign Abdomen: Rounded, obese, BSP Extremities: No edema, skin intact Neuro: Alert and oriented GU: Deferred  Resolved problem list   Assessment and Plan    # Hypotension requiring pressors # Septic Shock Hx: PD catheter Peritonitis - start hydrocortisone 100 mg every 8 hours - continue midodrine and florinef - levophed gtt as needed to target SBP greater than 80 - blood and respiratory cultures negative - merrem, LD vancomycin 11/14 (pharmacy driven) - albumin 25g x 1 (11/15)  # Hypoglycemia, recurrent - routine B - prn dextrose - diet plus supplement  # CKD # Anemia Hx: TThSat HD, recent new start - last iHD 11/14, no ultrafiltration due to hypotension - Nephrology following, appreciate recommendations    Labs   CBC: Recent Labs  Lab 05/06/24 1509 05/07/24 0552 05/08/24 0451 05/09/24 0651  WBC 15.7* 18.1* 15.3* 11.3*  NEUTROABS 13.3*  --   --  6.9  HGB 10.9* 8.3* 9.5* 9.3*  HCT 35.7* 27.5* 31.2* 29.9*  MCV 102.3* 104.2* 104.3* 100.3*  PLT 347 255 279 291    Basic Metabolic Panel: Recent Labs  Lab 05/06/24 1509 05/07/24 0552 05/07/24 1418 05/08/24 0451 05/09/24 0651 05/10/24 0148 05/10/24 0434  NA 141 141  --  136 137 134* 134*  K 3.7 3.3*  --  3.4* 3.7 3.2* 3.3*  CL 97* 104  --  100 101 102 99  CO2 31 27  --  23 20* 23 24  GLUCOSE 95 76  --  92 76 68* 106*  BUN 8 9  --  13 19 7* 7*  CREATININE 3.76* 4.75*  --  5.64* 6.95* 3.36* 3.73*  CALCIUM 9.3 8.4*  --  8.9 8.8* 7.5* 7.8*  MG 1.7  --  1.8  --  2.3 1.8  --   PHOS  --   --  2.0* 3.2 3.8  --   --    GFR: Estimated Creatinine Clearance: 11.4 mL/min (A) (by C-G formula based on SCr of 3.73 mg/dL (H)). Recent Labs  Lab 05/06/24 1509 05/06/24 1731 05/06/24 1943 05/07/24 0552  05/07/24 0845 05/07/24 1418 05/08/24 0451 05/09/24 0651  PROCALCITON  --   --   --   --   --  1.16  --   --   WBC 15.7*  --   --  18.1*  --   --  15.3* 11.3*  LATICACIDVEN  --  2.9* 3.5*  --  1.0  --   --   --     Liver Function Tests: Recent Labs  Lab 05/06/24 1509 05/07/24 1418 05/09/24 0651  AST 65* 45*  --   ALT 20 19  --   ALKPHOS 84 70  --   BILITOT 0.4 0.3  --   PROT 7.2 5.4*  --   ALBUMIN 4.0 2.9* 2.9*   No results for input(s): LIPASE, AMYLASE in the last 168 hours. No results for input(s): AMMONIA in the last 168 hours.  ABG No results found for: PHART, PCO2ART, PO2ART, HCO3, TCO2, ACIDBASEDEF, O2SAT   Coagulation Profile: Recent Labs  Lab 05/07/24 0552  INR 1.0    Cardiac Enzymes: No results for input(s): CKTOTAL, CKMB, CKMBINDEX, TROPONINI in the last 168 hours.  HbA1C: No results found for: HGBA1C  CBG: Recent Labs  Lab 05/10/24 0004 05/10/24 0356 05/10/24 0437 05/10/24 0749 05/10/24 0834  GLUCAP 92 60* 100* 53* 57*    Past Medical History:  She,  has no past medical history on file.   Surgical History:  History reviewed. No pertinent surgical history.   Social History:   reports that she has never smoked. She has never used smokeless tobacco. She reports that she does not drink alcohol and does not use drugs.   Family History:  Her family history includes Breast cancer (age of onset: 1) in her mother.   Allergies Allergies  Allergen Reactions   Hydrochlorothiazide Hives   Lisinopril Hives    Angioedema   Spironolactone Other (See Comments)    Other Reaction(s): Breast Pain  Breast pain   Empagliflozin Dermatitis     Home Medications  Prior to Admission medications   Medication Sig Start Date End Date Taking? Authorizing Provider  acetaminophen (TYLENOL) 325 MG tablet Take 650 mg by mouth every 4 (four) hours as needed for mild pain (pain score 1-3). 04/21/24  Yes [provider]   atorvastatin (LIPITOR) 20 MG tablet Take 20 mg by mouth daily. 06/16/22 06/25/24 Yes [provider]  cinacalcet (SENSIPAR) 60 MG tablet Take 60 mg by mouth daily. 12/13/23  Yes [provider]  epoetin alfa-epbx (RETACRIT) 10000 UNIT/ML injection Inject 10,000 Units into the skin 3 (three) times a week. Monday, Wednesday, Friday 04/23/24  Yes [provider]  fludrocortisone (FLORINEF) 0.1 MG tablet Take 0.1 mg by mouth daily. 03/28/24 03/28/25 Yes [provider]  fluticasone furoate-vilanterol (BREO ELLIPTA) 100-25 MCG/ACT AEPB Inhale 1 puff into the lungs daily. 03/27/24  Yes [provider]  gabapentin (NEURONTIN) 300 MG capsule Take 300 mg by mouth 2 (two) times daily. 10/24/22  Yes [provider]  hydroxychloroquine (PLAQUENIL) 200 MG tablet Take 200 mg by mouth daily.   Yes [provider]  lidocaine (LIDODERM) 5 % Place 1 patch onto the skin daily. Remove & Discard patch within 12 hours or as directed by MD   Yes [provider]  midodrine (PROAMATINE) 10 MG tablet Take 20 mg by mouth 3 (three) times daily. 03/27/24 07/25/24 Yes [provider]  ondansetron (ZOFRAN-ODT) 4 MG disintegrating tablet Take 4 mg by mouth every 8 (eight) hours as needed for nausea or vomiting.   Yes [provider]     Critical care time: 45 minutes    Akosua Constantine, ACNP-BC Pulmonary Critical Care, [place] Phone: [phone]

## 2024-05-10 NOTE — Procedures (Signed)
 ARTERIAL CATHETER INSERTION PROCEDURE NOTE  Cynthia Harvey  [id number]  1950-08-28  Date:05/10/24  Time:7:22 AM   Provider Performing: Almarie DELENA Nose   Procedure: Insertion of Arterial Line (63379) with US  guidance (23062)   Indication(s) Blood pressure monitoring and/or need for frequent ABGs  Consent Risks of the procedure as well as the alternatives and risks of each were explained to the patient and/or caregiver.  Consent for the procedure was obtained and is signed in the bedside chart  Anesthesia None  Time Out Verified patient identification, verified procedure, site/side was marked, verified correct patient position, special equipment/implants available, medications/allergies/relevant history reviewed, required imaging and test results available.  Sterile Technique Maximal sterile technique including full sterile barrier drape, hand hygiene, sterile gown, sterile gloves, mask, hair covering, sterile ultrasound probe cover (if used).  Procedure Description Area of catheter insertion was cleaned with chlorhexidine and draped in sterile fashion. With real-time ultrasound guidance an arterial catheter was placed into the right radial artery.  Appropriate arterial tracings confirmed on monitor.    Complications/Tolerance None; patient tolerated the procedure well.  EBL Minimal  Specimen(s) None     Almarie Nose DNP, CCRN, FNP-C, AGACNP-BC Acute Care & Family Nurse Practitioner [facility] PCCM on call pager [phone]

## 2024-05-10 NOTE — Progress Notes (Signed)
 Hypoglycemic Event  CBG: 53  Treatment: 8 oz juice/soda  Symptoms: None  Follow-up CBG: [id number] CBG Result:58  Possible Reasons for Event: Unknown  Comments/MD notified:Sudheer Jhonny, MD    Harlene Birmingham, RN

## 2024-05-10 NOTE — Plan of Care (Signed)
  Problem: Fluid Volume: Goal: Hemodynamic stability will improve Outcome: Not Progressing   Problem: Clinical Measurements: Goal: Diagnostic test results will improve Outcome: Not Progressing Goal: Signs and symptoms of infection will decrease Outcome: Not Progressing   Problem: Respiratory: Goal: Ability to maintain adequate ventilation will improve Outcome: Not Progressing   Problem: Education: Goal: Knowledge of General Education information will improve Description: Including pain rating scale, medication(s)/side effects and non-pharmacologic comfort measures Outcome: Not Progressing   Problem: Health Behavior/Discharge Planning: Goal: Ability to manage health-related needs will improve Outcome: Not Progressing   Problem: Clinical Measurements: Goal: Ability to maintain clinical measurements within normal limits will improve Outcome: Not Progressing Goal: Will remain free from infection Outcome: Not Progressing Goal: Diagnostic test results will improve Outcome: Not Progressing Goal: Respiratory complications will improve Outcome: Not Progressing Goal: Cardiovascular complication will be avoided Outcome: Not Progressing   Problem: Activity: Goal: Risk for activity intolerance will decrease Outcome: Not Progressing   Problem: Nutrition: Goal: Adequate nutrition will be maintained Outcome: Not Progressing

## 2024-05-10 NOTE — Progress Notes (Signed)
 Critical care note:  Date of note 05/10/2024  Subjective: The patient was admitted for hypoglycemia and hypotension and manage for suspected septic shock.  Pt has been off levo since this morning.  He has since been staying just above her blood pressure goals of systolics over 80s and maps over 60s.  Most recent blood pressures have been rather low.  Most recent 58/34 (44).  He he is mentation appropriately and is not experiencing any weakness or dizziness.  Levo orders currently d/c.  He is a dialysis pt with a 1200 fl restriction.  Objective: Physical examination: Generally: Acutely ill elderly African-American female in no acute respiratory distress. Vital signs:BP was 66/35 with a MAP of 46, heart rate 73, respiratory rate 11 and pulse oximetry 98% on room air.  Temperature was 97.8 earlier. Head - atraumatic, normocephalic.  Pupils - equal, round and reactive to light and accommodation. Extraocular movements are intact. No scleral icterus.  Oropharynx - moist mucous membranes and tongue. No pharyngeal erythema or exudate.  Neck - supple. No JVD. Carotid pulses 2+ bilaterally. No carotid bruits. No palpable thyromegaly or lymphadenopathy. Cardiovascular - regular rate and rhythm. Normal S1 and S2. No murmurs, gallops or rubs.  Lungs - clear to auscultation bilaterally.  Abdomen - soft and nontender. Positive bowel sounds. No palpable organomegaly or masses.  Extremities - no pitting edema, clubbing or cyanosis.  Neuro - grossly non-focal. Skin - no rashes. GU and rectal exam - deferred.  Labs and notes were reviewed.  Assessment/plan: 1.  Shock suspected to be septic shock. - The patient will be restarted on IV Levophed was ordered a bolus of 250 mL normal saline. - He will continue his midodrine and Florinef. - The patient has been on a course of IV vancomycin and meropenem that were later discontinued. - Effective IV Levophed can be followed before restarting IV antibiotics. - The  case was discussed with ICU team who will assume the care on IV vasopressor therapy.  Authorized and performed by: Madison Peaches, MD Total critical care time:  30      minutes. Due to a high probability of clinically significant, life-threatening deterioration, the patient required my highest level of preparedness to intervene emergently and I personally spent this critical care time directly and personally managing the patient.  This critical care time included obtaining a history, examining the patient, pulse oximetry, ordering and review of studies, arranging urgent treatment with development of management plan, evaluation of patient's response to treatment, frequent reassessment, and discussions with other providers. This critical care time was performed to assess and manage the high probability of imminent, life-threatening deterioration that could result in multiorgan failure.  It was exclusive of separately billable procedures and treating other patients and teaching time.

## 2024-05-10 NOTE — Procedures (Signed)
 Central Venous Catheter Insertion Procedure Note  Cynthia Harvey  [id number]  December 16, 1950  Date:05/10/24  Time:1:29 PM   Provider Performing:Selassie Spatafore [name]   Procedure: Insertion of Non-tunneled Central Venous [phone]) with US  guidance (23062)   Indication(s) Medication administration and Difficult access  Consent Risks of the procedure as well as the alternatives and risks of each were explained to the patient and/or caregiver.  Consent for the procedure was obtained and is signed in the bedside chart  Anesthesia Topical only with 1% lidocaine   Timeout Verified patient identification, verified procedure, site/side was marked, verified correct patient position, special equipment/implants available, medications/allergies/relevant history reviewed, required imaging and test results available.  Sterile Technique Maximal sterile technique including full sterile barrier drape, hand hygiene, sterile gown, sterile gloves, mask, hair covering, sterile ultrasound probe cover (if used).  Procedure Description Area of catheter insertion was cleaned with chlorhexidine and draped in sterile fashion.  With real-time ultrasound guidance a central venous catheter was placed into the right femoral vein. Nonpulsatile blood flow and easy flushing noted in all ports.  The catheter was sutured in place and sterile dressing applied.  Complications/Tolerance None; patient tolerated the procedure well. Chest X-ray is ordered to verify placement for internal jugular or subclavian cannulation.   Chest x-ray is not ordered for femoral cannulation.  EBL Minimal  Specimen(s) None  Debbie Morene, ACNP-BC Pulmonary Critical Care, [place] Phone: [phone]

## 2024-05-11 ENCOUNTER — Inpatient Hospital Stay
Admit: 2024-05-11 | Discharge: 2024-05-11 | Disposition: A | Discharge status: Home / Self Care | Attending: Student in an Organized Health Care Education/Training Program | Admitting: Student in an Organized Health Care Education/Training Program

## 2024-05-11 DIAGNOSIS — N186 End stage renal disease: Secondary | ICD-10-CM | POA: Diagnosis not present

## 2024-05-11 DIAGNOSIS — A419 Sepsis, unspecified organism: Secondary | ICD-10-CM | POA: Diagnosis not present

## 2024-05-11 DIAGNOSIS — Z992 Dependence on renal dialysis: Secondary | ICD-10-CM | POA: Diagnosis not present

## 2024-05-11 DIAGNOSIS — R579 Shock, unspecified: Secondary | ICD-10-CM | POA: Diagnosis not present

## 2024-05-11 DIAGNOSIS — R578 Other shock: Secondary | ICD-10-CM | POA: Diagnosis not present

## 2024-05-11 DIAGNOSIS — E2740 Unspecified adrenocortical insufficiency: Secondary | ICD-10-CM

## 2024-05-11 LAB — ECHOCARDIOGRAM COMPLETE
AR max vel: 2.63 cm2
AV Peak grad: 8 mmHg
Ao pk vel: 1.41 m/s
Area-P 1/2: 3.3 cm2
Height: 57 in
S' Lateral: 2.1 cm
Weight: 2560.86 [oz_av]

## 2024-05-11 LAB — BASIC METABOLIC PANEL WITH GFR
Anion gap: 11 (ref 5–15)
BUN: 10 mg/dL (ref 8–23)
CO2: 21 mmol/L — ABNORMAL LOW (ref 22–32)
Calcium: 8.2 mg/dL — ABNORMAL LOW (ref 8.9–10.3)
Chloride: 98 mmol/L (ref 98–111)
Creatinine, Ser: 4.61 mg/dL — ABNORMAL HIGH (ref 0.44–1.00)
GFR, Estimated: 10 mL/min — ABNORMAL LOW (ref >60)
Glucose, Bld: 104 mg/dL — ABNORMAL HIGH (ref 70–99)
Potassium: 5 mmol/L (ref 3.5–5.1)
Sodium: 129 mmol/L — ABNORMAL LOW (ref 135–145)

## 2024-05-11 LAB — CBC
HCT: 26.6 % — ABNORMAL LOW (ref 36.0–46.0)
Hemoglobin: 8.5 g/dL — ABNORMAL LOW (ref 12.0–15.0)
MCH: 31.7 pg (ref 26.0–34.0)
MCHC: 32 g/dL (ref 30.0–36.0)
MCV: 99.3 fL (ref 80.0–100.0)
Platelets: 270 K/uL (ref 150–400)
RBC: 2.68 MIL/uL — ABNORMAL LOW (ref 3.87–5.11)
RDW: 16.6 % — ABNORMAL HIGH (ref 11.5–15.5)
WBC: 6.9 K/uL (ref 4.0–10.5)
nRBC: 0 % (ref 0.0–0.2)

## 2024-05-11 LAB — CULTURE, BLOOD (SINGLE)
Culture: NO GROWTH
Special Requests: ADEQUATE

## 2024-05-11 LAB — GLUCOSE, CAPILLARY
Glucose-Capillary: 104 mg/dL — ABNORMAL HIGH (ref 70–99)
Glucose-Capillary: 94 mg/dL (ref 70–99)
Glucose-Capillary: 106 mg/dL — ABNORMAL HIGH (ref 70–99)
Glucose-Capillary: 97 mg/dL (ref 70–99)
Glucose-Capillary: 148 mg/dL — ABNORMAL HIGH (ref 70–99)
Glucose-Capillary: 95 mg/dL (ref 70–99)

## 2024-05-11 LAB — HEPATIC FUNCTION PANEL
ALT: 11 U/L (ref 0–44)
AST: 31 U/L (ref 15–41)
Albumin: 3.3 g/dL — ABNORMAL LOW (ref 3.5–5.0)
Alkaline Phosphatase: 65 U/L (ref 38–126)
Bilirubin, Direct: 0.2 mg/dL (ref 0.0–0.2)
Indirect Bilirubin: 0.2 mg/dL — ABNORMAL LOW (ref 0.3–0.9)
Total Bilirubin: 0.3 mg/dL (ref 0.0–1.2)
Total Protein: 5.4 g/dL — ABNORMAL LOW (ref 6.5–8.1)

## 2024-05-11 LAB — LACTIC ACID, PLASMA: Lactic Acid, Venous: 1.5 mmol/L (ref 0.5–1.9)

## 2024-05-11 LAB — MAGNESIUM: Magnesium: 1.8 mg/dL (ref 1.7–2.4)

## 2024-05-11 MED ORDER — ORAL CARE MOUTH RINSE: 15.0000 mL | OROMUCOSAL | Status: DC | PRN

## 2024-05-11 MED ORDER — MAGNESIUM SULFATE 2 GM/50ML IV SOLN
2.0000 g | Freq: Once | INTRAVENOUS | Status: AC
  Administered 2024-05-11: 2 g via INTRAVENOUS
  Filled 2024-05-11: qty 50

## 2024-05-11 NOTE — Care Management Important Message (Signed)
 Important Message  Patient Details  Name: Cynthia Harvey MRN: [id number] Date of Birth: April 10, 1951   Important Message Given:  Yes - Medicare IM     Rojelio [name] 05/11/2024, 6:03 PM

## 2024-05-11 NOTE — Progress Notes (Signed)
 [facility]  PROGRESS NOTE   Subjective:   Patient seen in the ICU.  Comfortable today.  Was dialyzed yesterday.  Off of Levophed.  Objective:  Vital signs: Blood pressure 118/68, pulse 71, temperature 98 F (36.7 C), temperature source Axillary, resp. rate 19, height 4' 9 (1.448 m), weight 72.6 kg, SpO2 100%.  Intake/Output Summary (Last 24 hours) at 05/11/2024 1059 Last data filed at 05/11/2024 0000 Gross per 24 hour  Intake 1104.55 ml  Output --  Net 1104.55 ml   Filed Weights   05/09/24 1717 05/10/24 0447 05/11/24 0500  Weight: 71.6 kg 72.6 kg 72.6 kg     Physical Exam: General:  No acute distress  Head:  Normocephalic, atraumatic. Moist oral mucosal membranes  Eyes:  Anicteric  Neck:  Supple  Lungs:   Clear to auscultation, normal effort  Heart:  S1S2 no rubs  Abdomen:   Soft, nontender, bowel sounds present  Extremities:  peripheral edema.  Neurologic:  Awake, alert, following commands  Skin:  No lesions  Access:     Basic Metabolic Panel: Recent Labs  Lab 05/06/24 1509 05/07/24 0552 05/07/24 1418 05/08/24 0451 05/09/24 0651 05/10/24 0148 05/10/24 0434 05/11/24 0404  NA 141   < >  --  136 137 134* 134* 129*  K 3.7   < >  --  3.4* 3.7 3.2* 3.3* 5.0  CL 97*   < >  --  100 101 102 99 98  CO2 31   < >  --  23 20* 23 24 21*  GLUCOSE 95   < >  --  92 76 68* 106* 104*  BUN 8   < >  --  13 19 7* 7* 10  CREATININE 3.76*   < >  --  5.64* 6.95* 3.36* 3.73* 4.61*  CALCIUM 9.3   < >  --  8.9 8.8* 7.5* 7.8* 8.2*  MG 1.7  --  1.8  --  2.3 1.8  --  1.8  PHOS  --   --  2.0* 3.2 3.8  --   --   --    < > = values in this interval not displayed.   GFR: Estimated Creatinine Clearance: 9.2 mL/min (A) (by C-G formula based on SCr of 4.61 mg/dL (H)).  Liver Function Tests: Recent Labs  Lab 05/06/24 1509 05/07/24 1418 05/09/24 0651 05/11/24 0404  AST 65* 45*  --  31  ALT 20 19  --  11  ALKPHOS 84 70  --  65  BILITOT 0.4 0.3  --  0.3  PROT 7.2  5.4*  --  5.4*  ALBUMIN 4.0 2.9* 2.9* 3.3*   No results for input(s): LIPASE, AMYLASE in the last 168 hours. No results for input(s): AMMONIA in the last 168 hours.  CBC: Recent Labs  Lab 05/06/24 1509 05/07/24 0552 05/08/24 0451 05/09/24 0651 05/11/24 0404  WBC 15.7* 18.1* 15.3* 11.3* 6.9  NEUTROABS 13.3*  --   --  6.9  --   HGB 10.9* 8.3* 9.5* 9.3* 8.5*  HCT 35.7* 27.5* 31.2* 29.9* 26.6*  MCV 102.3* 104.2* 104.3* 100.3* 99.3  PLT 347 255 279 291 270     HbA1C: No results found for: HGBA1C  Urinalysis: No results for input(s): COLORURINE, LABSPEC, PHURINE, GLUCOSEU, HGBUR, BILIRUBINUR, KETONESUR, PROTEINUR, UROBILINOGEN, NITRITE, LEUKOCYTESUR in the last 72 hours.  Invalid input(s): APPERANCEUR    Imaging: No results found.   Medications:    magnesium sulfate bolus IVPB 2 g (05/11/24 1036)  meropenem (MERREM) IV Stopped (05/10/24 2300)   norepinephrine (LEVOPHED) Adult infusion Stopped (05/10/24 1733)    atorvastatin  20 mg Oral Daily   Chlorhexidine Gluconate Cloth  6 each Topical Q0600   cinacalcet  60 mg Oral Q breakfast   feeding supplement (NEPRO CARB STEADY)  237 mL Oral BID BM   fludrocortisone  0.1 mg Oral Daily   fluticasone furoate-vilanterol  1 puff Inhalation Daily   gabapentin  300 mg Oral BID   heparin  5,000 Units Subcutaneous Q8H   hydrocortisone sod succinate (SOLU-CORTEF) inj  100 mg Intravenous Q8H   midodrine  20 mg Oral TID WC   multivitamin  1 tablet Oral QHS   pantoprazole (PROTONIX) IV  40 mg Intravenous Q24H   psyllium  1 packet Oral Daily    Assessment/ Plan:     73 y.o.  female  with past medical history of SLE, pAfib, chronic hypotension and end stage renal disease on hemodialysis now admitted with history of hypoglycemia and also was found to have sepsis and hypotension.  She was started on IV antibiotics and also Levophed.  she recently had peritoneal dialysis catheter removed.    #1:  End-stage renal disease: Patient was dialyzed Friday.  No fluid removal was done.  She was given albumin.   2: Anemia: Continue anemia protocols with IV iron and also erythropoietin.   #3: Hypotension and sepsis: Patient is being treated for septic shock.  She was given vancomycin and meropenem. Will continue the pressors and wean as tolerated.   #4: Secondary hyperparathyroidism: Continue to follow PTH, calcium and phosphorus levels.  Continue Sensipar.  Labs and medications reviewed. Will continue to follow along with you.   LOS: 4 Pinkey Edman, MD [facility] 11/16/202510:59 AM

## 2024-05-11 NOTE — Plan of Care (Signed)
   Problem: Fluid Volume: Goal: Hemodynamic stability will improve Outcome: Progressing   Problem: Clinical Measurements: Goal: Diagnostic test results will improve Outcome: Progressing Goal: Signs and symptoms of infection will decrease Outcome: Progressing   Problem: Respiratory: Goal: Ability to maintain adequate ventilation will improve Outcome: Progressing   Problem: Education: Goal: Knowledge of General Education information will improve Description: Including pain rating scale, medication(s)/side effects and non-pharmacologic comfort measures Outcome: Progressing   Problem: Health Behavior/Discharge Planning: Goal: Ability to manage health-related needs will improve Outcome: Progressing   Problem: Clinical Measurements: Goal: Ability to maintain clinical measurements within normal limits will improve Outcome: Progressing Goal: Will remain free from infection Outcome: Progressing Goal: Diagnostic test results will improve Outcome: Progressing Goal: Respiratory complications will improve Outcome: Progressing Goal: Cardiovascular complication will be avoided Outcome: Progressing   Problem: Activity: Goal: Risk for activity intolerance will decrease Outcome: Progressing   Problem: Nutrition: Goal: Adequate nutrition will be maintained Outcome: Progressing   Problem: Coping: Goal: Level of anxiety will decrease Outcome: Progressing   Problem: Elimination: Goal: Will not experience complications related to bowel motility Outcome: Progressing Goal: Will not experience complications related to urinary retention Outcome: Progressing

## 2024-05-11 NOTE — Plan of Care (Signed)
  Problem: Fluid Volume: Goal: Hemodynamic stability will improve 05/11/2024 0402 by Michaelyn Veronia Ivar FORBES, RN Outcome: Progressing 05/11/2024 0402 by Michaelyn Veronia, Ivar FORBES, RN Outcome: Progressing   Problem: Clinical Measurements: Goal: Diagnostic test results will improve 05/11/2024 0402 by Michaelyn Veronia Ivar FORBES, RN Outcome: Progressing 05/11/2024 0402 by Michaelyn Veronia, Ivar FORBES, RN Outcome: Progressing Goal: Signs and symptoms of infection will decrease 05/11/2024 0402 by Michaelyn Veronia Ivar FORBES, RN Outcome: Progressing 05/11/2024 0402 by Michaelyn Veronia, Ivar FORBES, RN Outcome: Progressing   Problem: Respiratory: Goal: Ability to maintain adequate ventilation will improve 05/11/2024 0402 by Michaelyn Veronia Ivar FORBES, RN Outcome: Progressing 05/11/2024 0402 by Michaelyn Veronia, Ivar FORBES, RN Outcome: Progressing

## 2024-05-11 NOTE — Progress Notes (Addendum)
 NAME:  Cynthia Harvey, MRN:  [id number], DOB:  Nov 02, 1950, LOS: 4 ADMISSION DATE:  05/06/2024, CONSULTATION DATE: 05/07/2024 REFERRING MD: Dr. Marsa, CHIEF COMPLAINT: Hypotension   History of Present Illness:  This is a 73 yo female via EMS from [facility] who presented to [facility] ER on 11/12 following a dialysis treatment she developed hypotension when she got back to [facility].  At the facility she received her scheduled fludrocortisone and midodrine 20 mg.  She was also found to be hypoglycemic CBG 54, therefore she was instructed to drink 3 cups of orange juice.  However, she was unable to drink all of it because she began to vomit.    She was recently transitioned from PD to HD during hospitalization at [facility] on 04/05/2024 to 04/21/2024 via a right tunneled catheter placed on 04/09/24.  However, she later required right tunneled hemodialysis catheter exchange on 04/23/2024 due to malfunctioning catheter.     ED Course  Upon arrival to the ER vital signs were: temp 97.7 F/sbp 85/hr 106/O2 sats 100%.  Significant lab results were: chloride 97/creatinine 3.76/AST 65/wbc 15.7/hgb 10.9/lactic acid 2.9.  CXR negative acute cardiopulmonary abnormality.  EKG revealed NSR, hr 80, no signs of ST elevation. Pt received 250 ml bolus due to hypotension.  Pt admitted to the progressive care unit per hospitalist team, however remained in the ED pending bed availability.  See detailed hospital course below under significant events.    Pertinent  Medical History  ESRD (transitioned from PD to HD during hospitalization at [facility] 10/11 to 04/21/2024 due to PD associated pseudomonas peritonitis) Anemia Due to CKD  Systemic Lupus Erythematosus  Hypotension Hypoglycemia  Atrial Fibrillation  Obesity   Micro Data:   MRSA PCR 11/12>>negative  Blood x1 11/12>>NGTD  Blood x1 11/12>>  Significant Hospital Events: Including procedures, antibiotic start and stop dates in addition to other pertinent  events   11/11: Admitted to the progressive care unit but remained in the ER pending bed availability with sepsis suspected secondary to bacterial peritonitis  11/12: Pt developed hypotension requiring peripheral levophed gtt and transfer to ICU PCCM assumed care.  Abxs broadened to meropenum and vancomycin.  IR consulted for paracentesis, however no ascites visible on ultrasound to allow for safe approach for paracentesis.    11/14: No longer on vasopressors. MAP goal reduced to 60. SBP goal reduced to 80. Nausea improved.  11/15: Transferred back to PCCM service overnight due to recurrent hypotension possibly requiring levophed gtt.  Arterial line placed which revealed cuff pressures are NOT accurate pt NOT hypotensive vasopressors stopped.  Will change status to stepdown and transfer service to [facility] to pick up on 11/17   Interim History / Subjective:  Pt reported she feels better, her nausea has resolved, and her appetite has improved.    Objective    Blood pressure 118/68, pulse 71, temperature 98 F (36.7 C), temperature source Axillary, resp. rate 19, height 4' 9 (1.448 m), weight 72.6 kg, SpO2 100%.        Intake/Output Summary (Last 24 hours) at 05/11/2024 0933 Last data filed at 05/11/2024 0000 Gross per 24 hour  Intake 1104.55 ml  Output --  Net 1104.55 ml   Filed Weights   05/09/24 1717 05/10/24 0447 05/11/24 0500  Weight: 71.6 kg 72.6 kg 72.6 kg   Examination: General: Well-developed, well-nourished female, NAD on RA   HENT: Supple, no JVD  Lungs: Clear throughout, even, non labored  Cardiovascular: NSR, s1s2, no m/r/g, 2+ radial/2+ distal  pulses, no edema  Abdomen: +BS x4, obese, soft, non tender, non distended  Extremities: Normal bulk and tone, moves all extremities  Skin: LLQ Abdominal wound from PD catheter replacement present on admission (see image below)   Neuro: Alert and oriented, follows commands, PERRLA  GU: Deferred   Resolved problem list    Assessment and Plan   #Circulatory shock~resolved   #Possible adrenal insufficiency  Hx: Chronic hypotension and atrial fibrillation  - Continuous telemetry monitoring  - Continue outpatient atorvastatin and midodrine - Continue stress dose steroids iv 100 mg q8hrs  - Maintain map 60 or higher and/or sbp 80 or higher: no longer requiring vasopressors    - Echo results pending  #ESRD on HD  #Hyponatremia  #Hypomagnesia  - Trend BMP  - Replace electrolytes as indicated  - Nephrology consulted appreciate input: HD per recommendations   #Possible peritonitis  #Nausea/vomiting~resolved   - Trend WBC and monitor fever curve - Repeat hepatic function panel   - KUB negative  - IR unable to perform paracentesis no ascites visible on US  to allow for safe approach - COVID/Influenza A&B/RSV: negative   #Systemic Lupus Erythematosus  - Will hold outpatient hydroxychloroquine for now given concern for sepsis   #Anemia without signs of active bleeding  - Trend CBC  - Monitor for s/sx of bleeding  - Transfuse for hgb <7   #Hypoglycemia  - Continue diet  - CBG's q4hrs  - Follow hyper/hypoglycemic protocol  - Target CBG readings 140 to 180 while in ICU   Labs   CBC: Recent Labs  Lab 05/06/24 1509 05/07/24 0552 05/08/24 0451 05/09/24 0651 05/11/24 0404  WBC 15.7* 18.1* 15.3* 11.3* 6.9  NEUTROABS 13.3*  --   --  6.9  --   HGB 10.9* 8.3* 9.5* 9.3* 8.5*  HCT 35.7* 27.5* 31.2* 29.9* 26.6*  MCV 102.3* 104.2* 104.3* 100.3* 99.3  PLT 347 255 279 291 270    Basic Metabolic Panel: Recent Labs  Lab 05/06/24 1509 05/07/24 0552 05/07/24 1418 05/08/24 0451 05/09/24 0651 05/10/24 0148 05/10/24 0434 05/11/24 0404  NA 141   < >  --  136 137 134* 134* 129*  K 3.7   < >  --  3.4* 3.7 3.2* 3.3* 5.0  CL 97*   < >  --  100 101 102 99 98  CO2 31   < >  --  23 20* 23 24 21*  GLUCOSE 95   < >  --  92 76 68* 106* 104*  BUN 8   < >  --  13 19 7* 7* 10  CREATININE 3.76*   < >  --   5.64* 6.95* 3.36* 3.73* 4.61*  CALCIUM 9.3   < >  --  8.9 8.8* 7.5* 7.8* 8.2*  MG 1.7  --  1.8  --  2.3 1.8  --  1.8  PHOS  --   --  2.0* 3.2 3.8  --   --   --    < > = values in this interval not displayed.   GFR: Estimated Creatinine Clearance: 9.2 mL/min (A) (by C-G formula based on SCr of 4.61 mg/dL (H)). Recent Labs  Lab 05/06/24 1731 05/06/24 1943 05/07/24 0552 05/07/24 0845 05/07/24 1418 05/08/24 0451 05/09/24 0651 05/10/24 1040 05/11/24 0404  PROCALCITON  --   --   --   --  1.16  --   --   --   --   WBC  --   --  18.1*  --   --  15.3* 11.3*  --  6.9  LATICACIDVEN 2.9* 3.5*  --  1.0  --   --   --  2.9*  --     Liver Function Tests: Recent Labs  Lab 05/06/24 1509 05/07/24 1418 05/09/24 0651  AST 65* 45*  --   ALT 20 19  --   ALKPHOS 84 70  --   BILITOT 0.4 0.3  --   PROT 7.2 5.4*  --   ALBUMIN 4.0 2.9* 2.9*   No results for input(s): LIPASE, AMYLASE in the last 168 hours. No results for input(s): AMMONIA in the last 168 hours.  ABG No results found for: PHART, PCO2ART, PO2ART, HCO3, TCO2, ACIDBASEDEF, O2SAT   Coagulation Profile: Recent Labs  Lab 05/07/24 0552  INR 1.0    Cardiac Enzymes: No results for input(s): CKTOTAL, CKMB, CKMBINDEX, TROPONINI in the last 168 hours.  HbA1C: No results found for: HGBA1C  CBG: Recent Labs  Lab 05/10/24 1546 05/10/24 1938 05/10/24 2344 05/11/24 0316 05/11/24 0807  GLUCAP 91 123* 121* 104* 94    Review of Systems: Positives in BOLD   Gen: Denies fever, chills, weight change, fatigue, night sweats HEENT: Denies blurred vision, double vision, hearing loss, tinnitus, sinus congestion, rhinorrhea, sore throat, neck stiffness, dysphagia PULM: Denies shortness of breath, cough, sputum production, hemoptysis, wheezing CV: Denies chest pain, edema, orthopnea, paroxysmal nocturnal dyspnea, palpitations GI:  Denies abdominal pain, nausea, vomiting, diarrhea, hematochezia, melena,  constipation, change in bowel habits GU: Denies dysuria, hematuria, polyuria, oliguria, urethral discharge Endocrine: Denies hot or cold intolerance, polyuria, polyphagia or appetite change Derm: Denies rash, dry skin, scaling or peeling skin change Heme: Denies easy bruising, bleeding, bleeding gums Neuro: Denies headache, numbness, weakness, slurred speech, loss of memory or consciousness  Past Medical History:  She,  has no past medical history on file.   Surgical History:  History reviewed. No pertinent surgical history.   Social History:   reports that she has never smoked. She has never used smokeless tobacco. She reports that she does not drink alcohol and does not use drugs.   Family History:  Her family history includes Breast cancer (age of onset: 11) in her mother.   Allergies Allergies  Allergen Reactions   Hydrochlorothiazide Hives   Lisinopril Hives    Angioedema   Spironolactone Other (See Comments)    Other Reaction(s): Breast Pain  Breast pain   Empagliflozin Dermatitis     Home Medications  Prior to Admission medications   Medication Sig Start Date End Date Taking? Authorizing Provider  acetaminophen (TYLENOL) 325 MG tablet Take 650 mg by mouth every 4 (four) hours as needed for mild pain (pain score 1-3). 04/21/24  Yes [provider]  atorvastatin (LIPITOR) 20 MG tablet Take 20 mg by mouth daily. 06/16/22 06/25/24 Yes [provider]  cinacalcet (SENSIPAR) 60 MG tablet Take 60 mg by mouth daily. 12/13/23  Yes [provider]  epoetin alfa-epbx (RETACRIT) 10000 UNIT/ML injection Inject 10,000 Units into the skin 3 (three) times a week. Monday, Wednesday, Friday 04/23/24  Yes [provider]  fludrocortisone (FLORINEF) 0.1 MG tablet Take 0.1 mg by mouth daily. 03/28/24 03/28/25 Yes [provider]  fluticasone furoate-vilanterol (BREO ELLIPTA) 100-25 MCG/ACT AEPB Inhale 1 puff into the lungs daily. 03/27/24  Yes  [provider]  gabapentin (NEURONTIN) 300 MG capsule Take 300 mg by mouth 2 (two) times daily. 10/24/22  Yes [provider]  hydroxychloroquine (PLAQUENIL) 200  MG tablet Take 200 mg by mouth daily.   Yes [provider]  lidocaine (LIDODERM) 5 % Place 1 patch onto the skin daily. Remove & Discard patch within 12 hours or as directed by MD   Yes [provider]  midodrine (PROAMATINE) 10 MG tablet Take 20 mg by mouth 3 (three) times daily. 03/27/24 07/25/24 Yes [provider]  ondansetron (ZOFRAN-ODT) 4 MG disintegrating tablet Take 4 mg by mouth every 8 (eight) hours as needed for nausea or vomiting.   Yes [provider]     Critical care time: 35 minutes      Lonell Moose, AGNP  Pulmonary/Critical Care Pager [phone] (please enter 7 digits) PCCM Consult Pager [phone] (please enter 7 digits)

## 2024-05-12 DIAGNOSIS — R6521 Severe sepsis with septic shock: Secondary | ICD-10-CM | POA: Diagnosis not present

## 2024-05-12 DIAGNOSIS — A419 Sepsis, unspecified organism: Secondary | ICD-10-CM | POA: Diagnosis not present

## 2024-05-12 LAB — BASIC METABOLIC PANEL WITH GFR
Anion gap: 10 (ref 5–15)
BUN: 17 mg/dL (ref 8–23)
CO2: 21 mmol/L — ABNORMAL LOW (ref 22–32)
Calcium: 8.6 mg/dL — ABNORMAL LOW (ref 8.9–10.3)
Chloride: 99 mmol/L (ref 98–111)
Creatinine, Ser: 5.37 mg/dL — ABNORMAL HIGH (ref 0.44–1.00)
GFR, Estimated: 8 mL/min — ABNORMAL LOW (ref >60)
Glucose, Bld: 99 mg/dL (ref 70–99)
Potassium: 5.2 mmol/L — ABNORMAL HIGH (ref 3.5–5.1)
Sodium: 130 mmol/L — ABNORMAL LOW (ref 135–145)

## 2024-05-12 LAB — CULTURE, BLOOD (SINGLE)
Culture: NO GROWTH
Special Requests: ADEQUATE

## 2024-05-12 LAB — CBC
HCT: 25.6 % — ABNORMAL LOW (ref 36.0–46.0)
Hemoglobin: 8.3 g/dL — ABNORMAL LOW (ref 12.0–15.0)
MCH: 31.7 pg (ref 26.0–34.0)
MCHC: 32.4 g/dL (ref 30.0–36.0)
MCV: 97.7 fL (ref 80.0–100.0)
Platelets: 242 K/uL (ref 150–400)
RBC: 2.62 MIL/uL — ABNORMAL LOW (ref 3.87–5.11)
RDW: 16.4 % — ABNORMAL HIGH (ref 11.5–15.5)
WBC: 11.7 K/uL — ABNORMAL HIGH (ref 4.0–10.5)
nRBC: 0 % (ref 0.0–0.2)

## 2024-05-12 LAB — GLUCOSE, CAPILLARY
Glucose-Capillary: 106 mg/dL — ABNORMAL HIGH (ref 70–99)
Glucose-Capillary: 88 mg/dL (ref 70–99)
Glucose-Capillary: 124 mg/dL — ABNORMAL HIGH (ref 70–99)

## 2024-05-12 LAB — MAGNESIUM: Magnesium: 2.7 mg/dL — ABNORMAL HIGH (ref 1.7–2.4)

## 2024-05-12 MED ORDER — HYDROCORTISONE SOD SUC (PF) 100 MG IJ SOLR
100.0000 mg | Freq: Two times a day (BID) | INTRAMUSCULAR | Status: DC
  Administered 2024-05-12 – 2024-05-13 (×2): 100 mg via INTRAVENOUS
  Filled 2024-05-12 (×2): qty 2

## 2024-05-12 MED ORDER — FLUDROCORTISONE ACETATE 0.1 MG PO TABS: 0.0500 mg | ORAL_TABLET | Freq: Every day | ORAL | Status: DC

## 2024-05-12 MED ORDER — FLUDROCORTISONE ACETATE 0.1 MG PO TABS
0.1000 mg | ORAL_TABLET | Freq: Every day | ORAL | Status: DC
  Administered 2024-05-13 – 2024-05-15 (×3): 0.1 mg via ORAL
  Filled 2024-05-12 (×3): qty 1

## 2024-05-12 NOTE — Progress Notes (Signed)
 Physical Therapy Treatment Patient Details Name: Cynthia Harvey MRN: [id number] DOB: 1950-08-24 Today's Date: 05/12/2024   History of Present Illness Patient is a 73 year old female admitted to ICU for management of spetic shock, requiring vasopressor support for BP management. PMH: ESRD on HD, SLE, pseudomonal peritonitis    PT Comments  Patient is agreeable to PT session. She continues to require assistance for mobility. Short distance ambulation performed with no dizziness reported, MAP of 71 and heart rate in the 120's with walking. The patient could benefit from continued PT to maximize independence. She is hopeful to return to [facility] for short term rehab before returning home.    If plan is discharge home, recommend the following: A little help with walking and/or transfers;A little help with bathing/dressing/bathroom;Assist for transportation;Supervision due to cognitive status;Assistance with cooking/housework   Can travel by private vehicle     No  Equipment Recommendations  None recommended by PT    Recommendations for Other Services       Precautions / Restrictions Precautions Precautions: Fall Recall of Precautions/Restrictions: Intact Precaution/Restrictions Comments: L wrist A line Restrictions Weight Bearing Restrictions Per Provider Order: No     Mobility  Bed Mobility Overal bed mobility: Needs Assistance Bed Mobility: Supine to Sit     Supine to sit: Mod assist, Min assist, +2 for physical assistance     General bed mobility comments: encouraged patient to avoid pushing and pulling with LUE due to A-line.    Transfers Overall transfer level: Needs assistance Equipment used: 2 person hand held assist Transfers: Sit to/from Stand Sit to Stand: Min assist, +2 physical assistance           General transfer comment: cues for safety and to maintain temporary limited use of LUE due to A-line. no dizziness is reported with upright activity wth MAP of  71 while standing.    Ambulation/Gait Ambulation/Gait assistance: Min assist, +2 physical assistance Gait Distance (Feet): 30 Feet Assistive device: None, 2 person hand held assist Gait Pattern/deviations: Step-through pattern, Decreased stride length, Narrow base of support Gait velocity: decreased     General Gait Details: activity tolerance limited by fatigue. heart rate up to 120's with walking with no shortness of breath noted with activity.   Stairs             Wheelchair Mobility     Tilt Bed    Modified Rankin (Stroke Patients Only)       Balance Overall balance assessment: Needs assistance Sitting-balance support: Feet supported Sitting balance-Leahy Scale: Good     Standing balance support: Bilateral upper extremity supported Standing balance-Leahy Scale: Poor Standing balance comment: at least unilateral UE support required                            Communication Communication Communication: No apparent difficulties  Cognition Arousal: Alert Behavior During Therapy: WFL for tasks assessed/performed, Flat affect   PT - Cognitive impairments: No apparent impairments                         Following commands: Intact      Cueing Cueing Techniques: Verbal cues  Exercises      General Comments        Pertinent Vitals/Pain Pain Assessment Pain Assessment: No/denies pain    Home Living  Prior Function            PT Goals (current goals can now be found in the care plan section) Acute Rehab PT Goals Patient Stated Goal: back to rehab before going home PT Goal Formulation: With patient Time For Goal Achievement: 05/22/24 Potential to Achieve Goals: Fair Progress towards PT goals: Progressing toward goals    Frequency    Min 2X/week      PT Plan      Co-evaluation   Reason for Co-Treatment: To address functional/ADL transfers (limited tolerance of activity) PT goals  addressed during session: Mobility/safety with mobility        AM-PAC PT 6 Clicks Mobility   Outcome Measure  Help needed turning from your back to your side while in a flat bed without using bedrails?: A Little Help needed moving from lying on your back to sitting on the side of a flat bed without using bedrails?: A Little Help needed moving to and from a bed to a chair (including a wheelchair)?: A Little Help needed standing up from a chair using your arms (e.g., wheelchair or bedside chair)?: A Little Help needed to walk in hospital room?: A Lot Help needed climbing 3-5 steps with a railing? : A Lot 6 Click Score: 16    End of Session   Activity Tolerance: Patient tolerated treatment well Patient left: in chair;with call bell/phone within reach Nurse Communication: Mobility status PT Visit Diagnosis: Muscle weakness (generalized) (M62.81);Unsteadiness on feet (R26.81)     Time: 8864-8841 PT Time Calculation (min) (ACUTE ONLY): 23 min  Charges:    $Therapeutic Activity: 8-22 mins PT General Charges $$ ACUTE PT VISIT: 1 Visit                     Randine Essex, PT, MPT   Randine LULLA Essex 05/12/2024, 1:15 PM

## 2024-05-12 NOTE — Progress Notes (Signed)
 Occupational Therapy Treatment Patient Details Name: Cynthia Harvey MRN: [id number] DOB: April 18, 1951 Today's Date: 05/12/2024   History of present illness Patient is a 73 year old female admitted to ICU for management of spetic shock, requiring vasopressor support for BP management. PMH: ESRD on HD, SLE, pseudomonal peritonitis   OT comments  Upon entering the room, pt supine in bed and agreeable to therapeutic intervention. Pt with new A line in L UE and unable to utilize for mobility. Pt needing min - mod A of 2 for bed mobility. Min HHA of 2 for transfer and pt ambulates to 34' and back to recliner chair in same manner. Pt seated in recliner chair with call bell and all needed items within reach. Pt making progress towards goals. Plan for A line to be removed today per RN.       If plan is discharge home, recommend the following:  A little help with walking and/or transfers;A little help with bathing/dressing/bathroom;Assistance with cooking/housework;Assist for transportation;Help with stairs or ramp for entrance   Equipment Recommendations  Other (comment) (defer to next venue of care)       Precautions / Restrictions Precautions Precautions: Fall Recall of Precautions/Restrictions: Intact Precaution/Restrictions Comments: L wrist A line       Mobility Bed Mobility Overal bed mobility: Needs Assistance Bed Mobility: Supine to Sit     Supine to sit: Mod assist, Min assist, +2 for physical assistance     General bed mobility comments: encouraged patient to avoid pushing and pulling with LUE due to A-line.    Transfers Overall transfer level: Needs assistance Equipment used: 2 person hand held assist Transfers: Sit to/from Stand Sit to Stand: Min assist, +2 physical assistance     Step pivot transfers: Min assist, +2 physical assistance           Balance Overall balance assessment: Needs assistance Sitting-balance support: Feet supported Sitting balance-Leahy  Scale: Good     Standing balance support: Bilateral upper extremity supported Standing balance-Leahy Scale: Poor                             ADL either performed or assessed with clinical judgement    Extremity/Trunk Assessment Upper Extremity Assessment Upper Extremity Assessment: Generalized weakness   Lower Extremity Assessment Lower Extremity Assessment: Generalized weakness        Vision Patient Visual Report: No change from baseline           Communication Communication Communication: No apparent difficulties   Cognition Arousal: Alert Behavior During Therapy: WFL for tasks assessed/performed, Flat affect Cognition: No apparent impairments                               Following commands: Intact        Cueing   Cueing Techniques: Verbal cues             Pertinent Vitals/ Pain       Pain Assessment Pain Assessment: No/denies pain         Frequency  Min 2X/week        Progress Toward Goals  OT Goals(current goals can now be found in the care plan section)  Progress towards OT goals: Progressing toward goals         Co-evaluation    PT/OT/SLP Co-Evaluation/Treatment: Yes Reason for Co-Treatment: To address functional/ADL transfers PT goals addressed during session: Mobility/safety with  mobility OT goals addressed during session: ADL's and self-care      AM-PAC OT 6 Clicks Daily Activity     Outcome Measure   Help from another person eating meals?: None Help from another person taking care of personal grooming?: None Help from another person toileting, which includes using toliet, bedpan, or urinal?: A Little Help from another person bathing (including washing, rinsing, drying)?: A Little Help from another person to put on and taking off regular upper body clothing?: None Help from another person to put on and taking off regular lower body clothing?: A Little 6 Click Score: 21    End of Session    OT  Visit Diagnosis: Unsteadiness on feet (R26.81);Muscle weakness (generalized) (M62.81)   Activity Tolerance Patient tolerated treatment well   Patient Left in chair;with call bell/phone within reach   Nurse Communication Mobility status        Time: 8859-8796 OT Time Calculation (min): 23 min  Charges: OT General Charges $OT Visit: 1 Visit OT Treatments $Therapeutic Activity: 8-22 mins  Izetta Claude, MS, OTR/L , CBIS ascom [phone]  05/12/24, 2:13 PM

## 2024-05-12 NOTE — Progress Notes (Signed)
 [facility]  ROUNDING NOTE   Subjective:   Patient sitting up in bed Completed breakfast tray at bedside Alert and oriented Denies pain Room air without shortness of breath  Objective:  Vital signs in last 24 hours:  Temp:  [97.9 F (36.6 C)-98.4 F (36.9 C)] 97.9 F (36.6 C) (11/17 1348) Pulse Rate:  [56-85] 76 (11/17 1100) Resp:  [10-32] 16 (11/17 1100) BP: (87-151)/(42-115) 105/48 (11/17 1100) SpO2:  [97 %-100 %] 97 % (11/17 0230) Arterial Line BP: (86-155)/(42-145) 125/78 (11/17 0800) Weight:  [70.2 kg] 70.2 kg (11/17 0500)  Weight change: -2.4 kg Filed Weights   05/10/24 0447 05/11/24 0500 05/12/24 0500  Weight: 72.6 kg 72.6 kg 70.2 kg    Intake/Output: I/O last 3 completed shifts: In: 497.3 [P.O.:240; I.V.:7.3; IV Piggyback:250] Out: -    Intake/Output this shift:  Total I/O In: 360 [P.O.:360] Out: -   Physical Exam: General: NAD  Head: Normocephalic, atraumatic. Moist oral mucosal membranes  Eyes: Anicteric  Lungs:  Clear to auscultation, normal effort  Heart: Regular rate and rhythm  Abdomen:  Soft, nontender  Extremities:  No peripheral edema.  Neurologic: Awake, alert, conversant  Skin: Warm,dry, no rash  Access: Rt internal jugular permcath    Basic Metabolic Panel: Recent Labs  Lab 05/07/24 1418 05/08/24 0451 05/09/24 0651 05/10/24 0148 05/10/24 0434 05/11/24 0404 05/12/24 0405  NA  --  136 137 134* 134* 129* 130*  K  --  3.4* 3.7 3.2* 3.3* 5.0 5.2*  CL  --  100 101 102 99 98 99  CO2  --  23 20* 23 24 21* 21*  GLUCOSE  --  92 76 68* 106* 104* 99  BUN  --  13 19 7* 7* 10 17  CREATININE  --  5.64* 6.95* 3.36* 3.73* 4.61* 5.37*  CALCIUM  --  8.9 8.8* 7.5* 7.8* 8.2* 8.6*  MG 1.8  --  2.3 1.8  --  1.8 2.7*  PHOS 2.0* 3.2 3.8  --   --   --   --     Liver Function Tests: Recent Labs  Lab 05/06/24 1509 05/07/24 1418 05/09/24 0651 05/11/24 0404  AST 65* 45*  --  31  ALT 20 19  --  11  ALKPHOS 84 70  --  65  BILITOT  0.4 0.3  --  0.3  PROT 7.2 5.4*  --  5.4*  ALBUMIN 4.0 2.9* 2.9* 3.3*   No results for input(s): LIPASE, AMYLASE in the last 168 hours. No results for input(s): AMMONIA in the last 168 hours.  CBC: Recent Labs  Lab 05/06/24 1509 05/07/24 0552 05/08/24 0451 05/09/24 0651 05/11/24 0404 05/12/24 0405  WBC 15.7* 18.1* 15.3* 11.3* 6.9 11.7*  NEUTROABS 13.3*  --   --  6.9  --   --   HGB 10.9* 8.3* 9.5* 9.3* 8.5* 8.3*  HCT 35.7* 27.5* 31.2* 29.9* 26.6* 25.6*  MCV 102.3* 104.2* 104.3* 100.3* 99.3 97.7  PLT 347 255 279 291 270 242    Cardiac Enzymes: No results for input(s): CKTOTAL, CKMB, CKMBINDEX, TROPONINI in the last 168 hours.  BNP: Invalid input(s): POCBNP  CBG: Recent Labs  Lab 05/11/24 1927 05/11/24 2326 05/12/24 0437 05/12/24 0734 05/12/24 1132  GLUCAP 148* 95 106* 88 124*    Microbiology: Results for orders placed or performed during the hospital encounter of 05/06/24  Blood culture (single)     Status: None   Collection Time: 05/06/24 11:05 PM   Specimen: BLOOD  Result  Value Ref Range Status   Specimen Description BLOOD BLOOD RIGHT HAND  Final   Special Requests   Final    BOTTLES DRAWN AEROBIC AND ANAEROBIC Blood Culture adequate volume   Culture   Final    NO GROWTH 5 DAYS Performed at [facility], [address] [place], [place] [address]    Report Status 05/11/2024 FINAL  Final  MRSA Next Gen by PCR, Nasal     Status: None   Collection Time: 05/07/24  9:37 AM   Specimen: Nasal Mucosa; Nasal Swab  Result Value Ref Range Status   MRSA by PCR Next Gen NOT DETECTED NOT DETECTED Final    Comment: (NOTE) The GeneXpert MRSA Assay (FDA approved for NASAL specimens only), is one component of a comprehensive MRSA colonization surveillance program. It is not intended to diagnose MRSA infection nor to guide or monitor treatment for MRSA infections. Test performance is not FDA approved in patients less than 30  years old. Performed at [facility], [address] [place], [place] [address]   Culture, blood (single) w Reflex to ID Panel     Status: None   Collection Time: 05/07/24 11:49 AM   Specimen: BLOOD  Result Value Ref Range Status   Specimen Description BLOOD BLOOD LEFT HAND  Final   Special Requests   Final    BOTTLES DRAWN AEROBIC AND ANAEROBIC Blood Culture adequate volume   Culture   Final    NO GROWTH 5 DAYS Performed at [facility], [address] [place], [place] [address]    Report Status 05/12/2024 FINAL  Final  Resp panel by RT-PCR (RSV, Flu A&B, Covid) Anterior Nasal Swab     Status: None   Collection Time: 05/07/24  3:12 PM   Specimen: Anterior Nasal Swab  Result Value Ref Range Status   SARS Coronavirus 2 by RT PCR NEGATIVE NEGATIVE Final    Comment: (NOTE) SARS-CoV-2 target nucleic acids are NOT DETECTED.  The SARS-CoV-2 RNA is generally detectable in upper respiratory specimens during the acute phase of infection. The lowest concentration of SARS-CoV-2 viral copies this assay can detect is 138 copies/mL. A negative result does not preclude SARS-Cov-2 infection and should not be used as the sole basis for treatment or other patient management decisions. A negative result may occur with  improper specimen collection/handling, submission of specimen other than nasopharyngeal swab, presence of viral mutation(s) within the areas targeted by this assay, and inadequate number of viral copies(<138 copies/mL). A negative result must be combined with clinical observations, patient history, and epidemiological information. The expected result is Negative.  Fact Sheet for Patients:  bloggercourse.com  Fact Sheet for Healthcare Providers:  seriousbroker.it  This test is no t yet approved or cleared by the [place]  FDA and  has been authorized for detection and/or diagnosis of SARS-CoV-2 by FDA  under an Emergency Use Authorization (EUA). This EUA will remain  in effect (meaning this test can be used) for the duration of the COVID-19 declaration under Section 564(b)(1) of the Act, 21 U.S.C.section 360bbb-3(b)(1), unless the authorization is terminated  or revoked sooner.       Influenza A by PCR NEGATIVE NEGATIVE Final   Influenza B by PCR NEGATIVE NEGATIVE Final    Comment: (NOTE) The Xpert Xpress SARS-CoV-2/FLU/RSV plus assay is intended as an aid in the diagnosis of influenza from Nasopharyngeal swab specimens and should not be used as a sole basis for treatment. Nasal washings and aspirates are unacceptable for  Xpert Xpress SARS-CoV-2/FLU/RSV testing.  Fact Sheet for Patients: bloggercourse.com  Fact Sheet for Healthcare Providers: seriousbroker.it  This test is not yet approved or cleared by the [place]  FDA and has been authorized for detection and/or diagnosis of SARS-CoV-2 by FDA under an Emergency Use Authorization (EUA). This EUA will remain in effect (meaning this test can be used) for the duration of the COVID-19 declaration under Section 564(b)(1) of the Act, 21 U.S.C. section 360bbb-3(b)(1), unless the authorization is terminated or revoked.     Resp Syncytial Virus by PCR NEGATIVE NEGATIVE Final    Comment: (NOTE) Fact Sheet for Patients: bloggercourse.com  Fact Sheet for Healthcare Providers: seriousbroker.it  This test is not yet approved or cleared by the [place]  FDA and has been authorized for detection and/or diagnosis of SARS-CoV-2 by FDA under an Emergency Use Authorization (EUA). This EUA will remain in effect (meaning this test can be used) for the duration of the COVID-19 declaration under Section 564(b)(1) of the Act, 21 U.S.C. section 360bbb-3(b)(1), unless the authorization is terminated or revoked.  Performed at [facility], [address] [place], [place] [address]     Coagulation Studies: No results for input(s): LABPROT, INR in the last 72 hours.   Urinalysis: No results for input(s): COLORURINE, LABSPEC, PHURINE, GLUCOSEU, HGBUR, BILIRUBINUR, KETONESUR, PROTEINUR, UROBILINOGEN, NITRITE, LEUKOCYTESUR in the last 72 hours.  Invalid input(s): APPERANCEUR    Imaging: ECHOCARDIOGRAM COMPLETE Result Date: 05/11/2024    ECHOCARDIOGRAM REPORT   Patient Name:   Cynthia [facility] Date of Exam: 05/11/2024 Medical Rec #:  [id number]          Height:       57.0 in Accession #:    [id number]         Weight:       160.1 lb Date of Birth:  [birth year]          BSA:          1.636 m Patient Age:    73 years           BP:           148/114 mmHg Patient Gender: F                  HR:           70 bpm. Exam Location:  [facility] Procedure: 2D Echo, Cardiac Doppler and Color Doppler (Both Spectral and Color            Flow Doppler were utilized during procedure). Indications:     Shock R57.9  History:         Patient has no prior history of Echocardiogram examinations.  Sonographer:     Bernice Rubinstein RDCS Referring Phys:  [id number] KHABIB DGAYLI Diagnosing Phys: Annabella Scarce MD  Sonographer Comments: Image acquisition challenging due to respiratory motion. IMPRESSIONS  1. Left ventricular ejection fraction, by estimation, is 55 to 60%. The left ventricle has normal function. The left ventricle has no regional wall motion abnormalities. Left ventricular diastolic parameters were normal.  2. Right ventricular systolic function is normal. The right ventricular size is normal. There is normal pulmonary artery systolic pressure.  3. Left atrial size was mildly dilated.  4. The mitral valve is normal in structure. Mild mitral valve regurgitation. No evidence of mitral stenosis.  5. The aortic valve is tricuspid. Aortic valve regurgitation is mild to moderate. No aortic stenosis is present.  6. The  inferior vena cava is normal in  size with greater than 50% respiratory variability, suggesting right atrial pressure of 3 mmHg. FINDINGS  Left Ventricle: Left ventricular ejection fraction, by estimation, is 55 to 60%. The left ventricle has normal function. The left ventricle has no regional wall motion abnormalities. The left ventricular internal cavity size was normal in size. There is  no left ventricular hypertrophy. Left ventricular diastolic parameters were normal. Normal left ventricular filling pressure. Right Ventricle: The right ventricular size is normal. No increase in right ventricular wall thickness. Right ventricular systolic function is normal. There is normal pulmonary artery systolic pressure. The tricuspid regurgitant velocity is 2.21 m/s, and  with an assumed right atrial pressure of 3 mmHg, the estimated right ventricular systolic pressure is 22.5 mmHg. Left Atrium: Left atrial size was mildly dilated. Right Atrium: Right atrial size was normal in size. Pericardium: There is no evidence of pericardial effusion. Mitral Valve: The mitral valve is normal in structure. Mild mitral valve regurgitation. No evidence of mitral valve stenosis. Tricuspid Valve: The tricuspid valve is normal in structure. Tricuspid valve regurgitation is mild . No evidence of tricuspid stenosis. Aortic Valve: The aortic valve is tricuspid. Aortic valve regurgitation is mild to moderate. No aortic stenosis is present. Aortic valve peak gradient measures 8.0 mmHg. Pulmonic Valve: The pulmonic valve was normal in structure. Pulmonic valve regurgitation is trivial. No evidence of pulmonic stenosis. Aorta: The aortic root is normal in size and structure. Venous: The inferior vena cava is normal in size with greater than 50% respiratory variability, suggesting right atrial pressure of 3 mmHg. IAS/Shunts: No atrial level shunt detected by color flow Doppler.  LEFT VENTRICLE PLAX 2D LVIDd:         3.00 cm   Diastology LVIDs:          2.10 cm   LV e' medial:    8.33 cm/s LV PW:         0.90 cm   LV E/e' medial:  8.7 LV IVS:        1.00 cm   LV e' lateral:   10.90 cm/s LVOT diam:     2.00 cm   LV E/e' lateral: 6.6 LV SV:         76 LV SV Index:   47 LVOT Area:     3.14 cm  RIGHT VENTRICLE RV Basal diam:  3.50 cm RV S prime:     10.80 cm/s TAPSE (M-mode): 1.8 cm LEFT ATRIUM             Index        RIGHT ATRIUM           Index LA diam:        3.20 cm 1.96 cm/m   RA Area:     12.00 cm LA Vol (A2C):   33.9 ml 20.72 ml/m  RA Volume:   26.80 ml  16.38 ml/m LA Vol (A4C):   49.9 ml 30.50 ml/m LA Biplane Vol: 42.9 ml 26.22 ml/m  AORTIC VALVE                 PULMONIC VALVE AV Area (Vmax): 2.63 cm     PV Vmax:          0.72 m/s AV Vmax:        141.00 cm/s  PV Peak grad:     2.1 mmHg AV Peak Grad:   8.0 mmHg     PR End Diast Vel: 0.99 msec LVOT Vmax:      118.00  cm/s  RVOT Peak grad:   0 mmHg LVOT Vmean:     73.100 cm/s LVOT VTI:       0.243 m  AORTA Ao Root diam: 3.10 cm Ao Asc diam:  3.10 cm MITRAL VALVE               TRICUSPID VALVE MV Area (PHT): 3.30 cm    TR Peak grad:   19.5 mmHg MV Decel Time: 230 msec    TR Vmax:        221.00 cm/s MV E velocity: 72.20 cm/s MV A velocity: 95.20 cm/s  SHUNTS MV E/A ratio:  0.76        Systemic VTI:  0.24 m                            Systemic Diam: 2.00 cm Annabella Scarce MD Electronically signed by Annabella Scarce MD Signature Date/Time: 05/11/2024/12:07:39 PM    Final      Medications:    meropenem (MERREM) IV Stopped (05/11/24 2223)    atorvastatin  20 mg Oral Daily   Chlorhexidine Gluconate Cloth  6 each Topical Q0600   cinacalcet  60 mg Oral Q breakfast   feeding supplement (NEPRO CARB STEADY)  237 mL Oral BID BM   fludrocortisone  0.1 mg Oral Daily   fluticasone furoate-vilanterol  1 puff Inhalation Daily   gabapentin  300 mg Oral BID   heparin  5,000 Units Subcutaneous Q8H   hydrocortisone sod succinate (SOLU-CORTEF) inj  100 mg Intravenous Q8H   midodrine  20 mg Oral TID WC    multivitamin  1 tablet Oral QHS   pantoprazole (PROTONIX) IV  40 mg Intravenous Q24H   psyllium  1 packet Oral Daily   acetaminophen **OR** acetaminophen, dextrose, HYDROcodone-acetaminophen, loperamide, ondansetron **OR** ondansetron (ZOFRAN) IV, mouth rinse  Assessment/ Plan:  Cynthia Harvey is a 73 y.o.  female  with past medical history of SLE, pAfib, chronic hypotension and end stage renal disease on hemodialysis, who was admitted to [facility] on 05/06/2024 for Hypoglycemia [E16.2] Hypotension [I95.9] Sepsis (HCC) [A41.9] Hypotension, unspecified hypotension type [I95.9]   End-stage renal disease on hemodialysis. Next treatment scheduled for Tuesday.  2. Anemia of chronic kidney disease Lab Results  Component Value Date   HGB 8.3 (L) 05/12/2024  Patient receives Mircera at outpatient clinic. Hgb reduced. Will order Retacrit with dialysis.   3. Secondary Hyperparathyroidism: with outpatient labs: PTH 366, phosphorus 4.5, calcium 8.6 on 11//25    Lab Results  Component Value Date   CALCIUM 8.6 (L) 05/12/2024   PHOS 3.8 05/09/2024    Patient prescribed calcitriol, cholecalciferol, Sensipar, and sevelamer outpatient. Continue Sensipar.  Bone minerals acceptable for this patient.   4. Hypotension likely secondary to sepsis. Suspected bacterial peritonitis . Recent PD catheter removal. Elevated white count and lactic acid. Primary team has ordered meropenem.  Prescribed midodrine   LOS: 5 Desiraye Rolfson 11/17/20251:54 PM

## 2024-05-12 NOTE — Progress Notes (Signed)
 PROGRESS NOTE    Cynthia Harvey   FMW:[id number] DOB: December 26, 1950  DOA: 05/06/2024 Date of Service: 05/12/24 which is hospital day 5  PCP: Henry Fitch, MD    Hospital course / significant events:   HPI:  Cynthia Harvey is a 73 y.o. female with medical history significant for SLE on hydroxychloroquine, complicated by ESRD recently transitioned from PD to HD during a hospitalization at [facility] (10/11 to 04/21/2024) for PD associated Pseudomonas peritonitis(meropenem-> Cipro, completed 04/28/2024), brief paroxysmal A-fib during that hospitalization (not placed on [facility]), chronic hypotension on midodrine and Florinef presenting w/ chief complaint hypotension, sent by dialysis center.   during her recent hospitalization she had recurring problems with hypotension/hypoglycemia s/p cortisol stimulation test with normal cortisol values.   11/11: admitted to hospitalist service with concern for sepsis, suspect from SBP  11/12: BP continuing to drop, transfer to ICU service for pressors. IR attempted paracentesis but no ascites visible for sampling. Crotisol level was 13.3, and this might represent mild functional adrenal insufficiency, so hydrocortisone was started.  11/13: holding off dialysis d/t low BP, remains on levo --> off later in the day. Pt c/o nausea CT Abd/Pelvis non concerning   11/14: midodrine 20 mg 3 times daily, dialysis treatment today with no UF, Albumin 25 g IV given.  No longer on vasopressors.  MAP goal reduced to 60.  SBP goal reduced to 80.  Nausea improved. 11/15: overnight hypotensive again repeat lactic acid showing some poor perfusion with an increased value of 2.9. and back to ICU on levophed. Art line inserted for close BP monitoring. It is unclear what the source of the patient's continued hypotension is, cultures remaining negative and broad spectrum antibiotics day 4 today. Off pressors again in afternoon - Arterial line revealed cuff pressures are NOT accurate pt  NOT hypotensive so vasopressors stopped.  Echo pending 11/16: remain off pressors. Dialysis today. Plan transfer to hospitalist tomorrow. Echo LVEF 55-60 no RWMA, normal diastolic, no significant valve abn 11/17: hospitalist assumes care. Art line out.        Consultants:  PCCU Nephrology  Interventional Radiology   Procedures/Surgeries: 11/15: art line R radial 11/15: CVC line R femoral       ASSESSMENT & PLAN:   #Circulatory shock~resolved   #Possible adrenal insufficiency  Hx: Chronic hypotension and atrial fibrillation  telemetry monitoring  Midodrine Art line in place for close BP monitoring / accurate measurement  stress dose steroids iv 100 mg q8hrs --> wean off to q12h  Continue po Florinef   #ESRD on HD  #Hyponatremia  #Hypomagnesia  Trend BMP  Replace electrolytes as indicated  Nephrology consulted appreciate input: HD per recommendations    #Possible peritonitis  #Nausea/vomiting~resolved   Trend WBC and monitor fever curve Repeat hepatic function panel   KUB negative  IR unable to perform paracentesis no ascites visible on US  to allow for safe approach COVID/Influenza A&B/RSV: negative    #Systemic Lupus Erythematosus  hold outpatient hydroxychloroquine for now given concern for sepsis    #Anemia without signs of active bleeding  trend CBC  Monitor for s/sx of bleeding  Transfuse for hgb <7    #Hypoglycemia  Continue diet  CBG's q4hrs --> as needed  Follow hyper/hypoglycemic protocol     Class 1 obesity based on BMI: Body mass index is 33.49 kg/m.[name] Significantly low or high BMI is associated with higher medical risk.  Underweight - under 18  overweight - 25 to 29 obese - 30 or more Class  1 obesity: BMI of 30.0 to 34 Class 2 obesity: BMI of 35.0 to 39 Class 3 obesity: BMI of 40.0 to 49 Super Morbid Obesity: BMI 50-59 Super-super Morbid Obesity: BMI 60+ Healthy nutrition and physical activity advised as adjunct to other disease  management and risk reduction treatments    DVT prophylaxis: heparin IV fluids: no continuous IV fluids  Nutrition: regular w/ fluid restriction Central lines / other devices: art line removed today, CVC in place as this is only access  Code Status: FULL DOE ACP documentation reviewed:  none on file in [facility]  TOC needs: TBD Medical barriers to dispo: weaning IV steroids. Expected medical readiness for discharge next couple days.              Subjective / Brief ROS:  Patient reports feeling tired today but improved overall Denies CP/SOB.  Pain controlled.  Denies new weakness.  Tolerating diet.  Reports no concerns w/ urination/defecation.   Family Communication: none at bedside     Objective Findings:  Vitals:   05/12/24 1345 05/12/24 1348 05/12/24 1400 05/12/24 1500  BP: (!) 92/46  108/62 115/73  Pulse:   74 67  Resp: (!) 21  14   Temp:  97.9 F (36.6 C)    TempSrc:  Oral    SpO2:      Weight:      Height:        Intake/Output Summary (Last 24 hours) at 05/12/2024 1602 Last data filed at 05/12/2024 1000 Gross per 24 hour  Intake 700 ml  Output --  Net 700 ml   Filed Weights   05/10/24 0447 05/11/24 0500 05/12/24 0500  Weight: 72.6 kg 72.6 kg 70.2 kg    Examination:  Physical Exam Constitutional:      General: She is not in acute distress. Cardiovascular:     Rate and Rhythm: Normal rate and regular rhythm.  Pulmonary:     Effort: Pulmonary effort is normal.     Breath sounds: Normal breath sounds.  Abdominal:     Palpations: Abdomen is soft.  Musculoskeletal:     Right lower leg: No edema.     Left lower leg: No edema.  Skin:    General: Skin is warm and dry.  Neurological:     Mental Status: She is alert and oriented to person, place, and time.  Psychiatric:        Mood and Affect: Mood normal.        Behavior: Behavior normal.          Scheduled Medications:   atorvastatin  20 mg Oral Daily   Chlorhexidine Gluconate  Cloth  6 each Topical Q0600   cinacalcet  60 mg Oral Q breakfast   feeding supplement (NEPRO CARB STEADY)  237 mL Oral BID BM   [START ON 05/13/2024] fludrocortisone  0.1 mg Oral Daily   fluticasone furoate-vilanterol  1 puff Inhalation Daily   gabapentin  300 mg Oral BID   heparin  5,000 Units Subcutaneous Q8H   hydrocortisone sod succinate (SOLU-CORTEF) inj  100 mg Intravenous Q12H   midodrine  20 mg Oral TID WC   multivitamin  1 tablet Oral QHS   pantoprazole (PROTONIX) IV  40 mg Intravenous Q24H   psyllium  1 packet Oral Daily    Continuous Infusions:  meropenem (MERREM) IV Stopped (05/11/24 2223)    PRN Medications:  acetaminophen **OR** acetaminophen, dextrose, HYDROcodone-acetaminophen, loperamide, ondansetron **OR** ondansetron (ZOFRAN) IV, mouth rinse  Antimicrobials from admission:  Anti-infectives (  From admission, onward)    Start     Dose/Rate Route Frequency Ordered Stop   05/09/24 1800  vancomycin (VANCOREADY) IVPB 750 mg/150 mL  Status:  Discontinued        750 mg 150 mL/hr over 60 Minutes Intravenous  Once 05/09/24 0753 05/09/24 1343   05/08/24 1800  vancomycin (VANCOREADY) IVPB 750 mg/150 mL  Status:  Discontinued        750 mg 150 mL/hr over 60 Minutes Intravenous  Once 05/08/24 0822 05/08/24 0850   05/08/24 1200  vancomycin (VANCOREADY) IVPB 750 mg/150 mL  Status:  Discontinued        750 mg 150 mL/hr over 60 Minutes Intravenous Every Dialysis 05/08/24 0822 05/08/24 0827   05/08/24 1200  vancomycin (VANCOREADY) IVPB 750 mg/150 mL  Status:  Discontinued        750 mg 150 mL/hr over 60 Minutes Intravenous Every T-Th-Sa (Hemodialysis) 05/08/24 0827 05/08/24 1131   05/07/24 2200  meropenem (MERREM) 500 mg in sodium chloride 0.9 % 100 mL IVPB        500 mg 200 mL/hr over 30 Minutes Intravenous Daily at bedtime 05/07/24 0837 05/13/24 2359   05/07/24 1300  vancomycin (VANCOREADY) IVPB 1500 mg/300 mL        1,500 mg 150 mL/hr over 120 Minutes Intravenous  Once  05/07/24 1137 05/07/24 1419   05/07/24 1000  hydroxychloroquine (PLAQUENIL) tablet 200 mg  Status:  Discontinued        200 mg Oral Daily 05/06/24 2238 05/07/24 1501   05/07/24 0845  meropenem (MERREM) 500 mg in sodium chloride 0.9 % 100 mL IVPB        500 mg 200 mL/hr over 30 Minutes Intravenous  Once 05/07/24 0834 05/07/24 1149   05/06/24 2100  metroNIDAZOLE (FLAGYL) IVPB 500 mg  Status:  Discontinued        500 mg 100 mL/hr over 60 Minutes Intravenous Every 12 hours 05/06/24 1943 05/07/24 0833   05/06/24 2030  cefTRIAXone (ROCEPHIN) 2 g in sodium chloride 0.9 % 100 mL IVPB  Status:  Discontinued        2 g 200 mL/hr over 30 Minutes Intravenous Every 24 hours 05/06/24 1943 05/07/24 9167           Data Reviewed:  I have personally reviewed the following...  CBC: Recent Labs  Lab 05/06/24 1509 05/07/24 0552 05/08/24 0451 05/09/24 0651 05/11/24 0404 05/12/24 0405  WBC 15.7* 18.1* 15.3* 11.3* 6.9 11.7*  NEUTROABS 13.3*  --   --  6.9  --   --   HGB 10.9* 8.3* 9.5* 9.3* 8.5* 8.3*  HCT 35.7* 27.5* 31.2* 29.9* 26.6* 25.6*  MCV 102.3* 104.2* 104.3* 100.3* 99.3 97.7  PLT 347 255 279 291 270 242   Basic Metabolic Panel: Recent Labs  Lab 05/07/24 1418 05/08/24 0451 05/09/24 0651 05/10/24 0148 05/10/24 0434 05/11/24 0404 05/12/24 0405  NA  --  136 137 134* 134* 129* 130*  K  --  3.4* 3.7 3.2* 3.3* 5.0 5.2*  CL  --  100 101 102 99 98 99  CO2  --  23 20* 23 24 21* 21*  GLUCOSE  --  92 76 68* 106* 104* 99  BUN  --  13 19 7* 7* 10 17  CREATININE  --  5.64* 6.95* 3.36* 3.73* 4.61* 5.37*  CALCIUM  --  8.9 8.8* 7.5* 7.8* 8.2* 8.6*  MG 1.8  --  2.3 1.8  --  1.8 2.7*  PHOS 2.0* 3.2  3.8  --   --   --   --    GFR: Estimated Creatinine Clearance: 7.8 mL/min (A) (by C-G formula based on SCr of 5.37 mg/dL (H)). Liver Function Tests: Recent Labs  Lab 05/06/24 1509 05/07/24 1418 05/09/24 0651 05/11/24 0404  AST 65* 45*  --  31  ALT 20 19  --  11  ALKPHOS 84 70  --  65   BILITOT 0.4 0.3  --  0.3  PROT 7.2 5.4*  --  5.4*  ALBUMIN 4.0 2.9* 2.9* 3.3*   No results for input(s): LIPASE, AMYLASE in the last 168 hours. No results for input(s): AMMONIA in the last 168 hours. Coagulation Profile: Recent Labs  Lab 05/07/24 0552  INR 1.0   Cardiac Enzymes: No results for input(s): CKTOTAL, CKMB, CKMBINDEX, TROPONINI in the last 168 hours. BNP (last 3 results) No results for input(s): PROBNP in the last 8760 hours. HbA1C: No results for input(s): HGBA1C in the last 72 hours. CBG: Recent Labs  Lab 05/11/24 1927 05/11/24 2326 05/12/24 0437 05/12/24 0734 05/12/24 1132  GLUCAP 148* 95 106* 88 124*   Lipid Profile: No results for input(s): CHOL, HDL, LDLCALC, TRIG, CHOLHDL, LDLDIRECT in the last 72 hours. Thyroid  Function Tests: No results for input(s): TSH, T4TOTAL, FREET4, T3FREE, THYROIDAB in the last 72 hours. Anemia Panel: No results for input(s): VITAMINB12, FOLATE, FERRITIN, TIBC, IRON, RETICCTPCT in the last 72 hours. Most Recent Urinalysis On File:  No results found for: COLORURINE, APPEARANCEUR, LABSPEC, PHURINE, GLUCOSEU, HGBUR, BILIRUBINUR, KETONESUR, PROTEINUR, UROBILINOGEN, NITRITE, LEUKOCYTESUR Sepsis Labs: @LABRCNTIP (procalcitonin:4,lacticidven:4) Microbiology: Recent Results (from the past 240 hours)  Blood culture (single)     Status: None   Collection Time: 05/06/24 11:05 PM   Specimen: BLOOD  Result Value Ref Range Status   Specimen Description BLOOD BLOOD RIGHT HAND  Final   Special Requests   Final    BOTTLES DRAWN AEROBIC AND ANAEROBIC Blood Culture adequate volume   Culture   Final    NO GROWTH 5 DAYS Performed at [facility], [address] [place], [place] [address]    Report Status 05/11/2024 FINAL  Final  MRSA Next Gen by PCR, Nasal     Status: None   Collection Time: 05/07/24  9:37 AM   Specimen: Nasal Mucosa; Nasal Swab  Result  Value Ref Range Status   MRSA by PCR Next Gen NOT DETECTED NOT DETECTED Final    Comment: (NOTE) The GeneXpert MRSA Assay (FDA approved for NASAL specimens only), is one component of a comprehensive MRSA colonization surveillance program. It is not intended to diagnose MRSA infection nor to guide or monitor treatment for MRSA infections. Test performance is not FDA approved in patients less than 48 years old. Performed at [facility], [address] [place], [place] [address]   Culture, blood (single) w Reflex to ID Panel     Status: None   Collection Time: 05/07/24 11:49 AM   Specimen: BLOOD  Result Value Ref Range Status   Specimen Description BLOOD BLOOD LEFT HAND  Final   Special Requests   Final    BOTTLES DRAWN AEROBIC AND ANAEROBIC Blood Culture adequate volume   Culture   Final    NO GROWTH 5 DAYS Performed at [facility], [address] [place], [place] [address]    Report Status 05/12/2024 FINAL  Final  Resp panel by RT-PCR (RSV, Flu A&B, Covid) Anterior Nasal Swab     Status: None   Collection Time:  05/07/24  3:12 PM   Specimen: Anterior Nasal Swab  Result Value Ref Range Status   SARS Coronavirus 2 by RT PCR NEGATIVE NEGATIVE Final    Comment: (NOTE) SARS-CoV-2 target nucleic acids are NOT DETECTED.  The SARS-CoV-2 RNA is generally detectable in upper respiratory specimens during the acute phase of infection. The lowest concentration of SARS-CoV-2 viral copies this assay can detect is 138 copies/mL. A negative result does not preclude SARS-Cov-2 infection and should not be used as the sole basis for treatment or other patient management decisions. A negative result may occur with  improper specimen collection/handling, submission of specimen other than nasopharyngeal swab, presence of viral mutation(s) within the areas targeted by this assay, and inadequate number of viral copies(<138 copies/mL). A negative result must be combined  with clinical observations, patient history, and epidemiological information. The expected result is Negative.  Fact Sheet for Patients:  bloggercourse.com  Fact Sheet for Healthcare Providers:  seriousbroker.it  This test is no t yet approved or cleared by the [place]  FDA and  has been authorized for detection and/or diagnosis of SARS-CoV-2 by FDA under an Emergency Use Authorization (EUA). This EUA will remain  in effect (meaning this test can be used) for the duration of the COVID-19 declaration under Section 564(b)(1) of the Act, 21 U.S.C.section 360bbb-3(b)(1), unless the authorization is terminated  or revoked sooner.       Influenza A by PCR NEGATIVE NEGATIVE Final   Influenza B by PCR NEGATIVE NEGATIVE Final    Comment: (NOTE) The Xpert Xpress SARS-CoV-2/FLU/RSV plus assay is intended as an aid in the diagnosis of influenza from Nasopharyngeal swab specimens and should not be used as a sole basis for treatment. Nasal washings and aspirates are unacceptable for Xpert Xpress SARS-CoV-2/FLU/RSV testing.  Fact Sheet for Patients: bloggercourse.com  Fact Sheet for Healthcare Providers: seriousbroker.it  This test is not yet approved or cleared by the [place]  FDA and has been authorized for detection and/or diagnosis of SARS-CoV-2 by FDA under an Emergency Use Authorization (EUA). This EUA will remain in effect (meaning this test can be used) for the duration of the COVID-19 declaration under Section 564(b)(1) of the Act, 21 U.S.C. section 360bbb-3(b)(1), unless the authorization is terminated or revoked.     Resp Syncytial Virus by PCR NEGATIVE NEGATIVE Final    Comment: (NOTE) Fact Sheet for Patients: bloggercourse.com  Fact Sheet for Healthcare Providers: seriousbroker.it  This test is not yet approved  or cleared by the [place]  FDA and has been authorized for detection and/or diagnosis of SARS-CoV-2 by FDA under an Emergency Use Authorization (EUA). This EUA will remain in effect (meaning this test can be used) for the duration of the COVID-19 declaration under Section 564(b)(1) of the Act, 21 U.S.C. section 360bbb-3(b)(1), unless the authorization is terminated or revoked.  Performed at [facility], [address] [place], [place] [address]       Radiology Studies last 3 days: ECHOCARDIOGRAM COMPLETE Result Date: 05/11/2024    ECHOCARDIOGRAM REPORT   Patient Name:   EVYNN BOUTELLE [facility] Date of Exam: 05/11/2024 Medical Rec #:  [id number]          Height:       57.0 in Accession #:    [id number]         Weight:       160.1 lb Date of Birth:  Feb 04, 1951          BSA:  1.636 m Patient Age:    73 years           BP:           148/114 mmHg Patient Gender: F                  HR:           70 bpm. Exam Location:  [facility] Procedure: 2D Echo, Cardiac Doppler and Color Doppler (Both Spectral and Color            Flow Doppler were utilized during procedure). Indications:     Shock R57.9  History:         Patient has no prior history of Echocardiogram examinations.  Sonographer:     Bernice Rubinstein RDCS Referring Phys:  [id number] KHABIB DGAYLI Diagnosing Phys: Annabella Scarce MD  Sonographer Comments: Image acquisition challenging due to respiratory motion. IMPRESSIONS  1. Left ventricular ejection fraction, by estimation, is 55 to 60%. The left ventricle has normal function. The left ventricle has no regional wall motion abnormalities. Left ventricular diastolic parameters were normal.  2. Right ventricular systolic function is normal. The right ventricular size is normal. There is normal pulmonary artery systolic pressure.  3. Left atrial size was mildly dilated.  4. The mitral valve is normal in structure. Mild mitral valve regurgitation. No evidence of mitral stenosis.  5. The aortic valve  is tricuspid. Aortic valve regurgitation is mild to moderate. No aortic stenosis is present.  6. The inferior vena cava is normal in size with greater than 50% respiratory variability, suggesting right atrial pressure of 3 mmHg. FINDINGS  Left Ventricle: Left ventricular ejection fraction, by estimation, is 55 to 60%. The left ventricle has normal function. The left ventricle has no regional wall motion abnormalities. The left ventricular internal cavity size was normal in size. There is  no left ventricular hypertrophy. Left ventricular diastolic parameters were normal. Normal left ventricular filling pressure. Right Ventricle: The right ventricular size is normal. No increase in right ventricular wall thickness. Right ventricular systolic function is normal. There is normal pulmonary artery systolic pressure. The tricuspid regurgitant velocity is 2.21 m/s, and  with an assumed right atrial pressure of 3 mmHg, the estimated right ventricular systolic pressure is 22.5 mmHg. Left Atrium: Left atrial size was mildly dilated. Right Atrium: Right atrial size was normal in size. Pericardium: There is no evidence of pericardial effusion. Mitral Valve: The mitral valve is normal in structure. Mild mitral valve regurgitation. No evidence of mitral valve stenosis. Tricuspid Valve: The tricuspid valve is normal in structure. Tricuspid valve regurgitation is mild . No evidence of tricuspid stenosis. Aortic Valve: The aortic valve is tricuspid. Aortic valve regurgitation is mild to moderate. No aortic stenosis is present. Aortic valve peak gradient measures 8.0 mmHg. Pulmonic Valve: The pulmonic valve was normal in structure. Pulmonic valve regurgitation is trivial. No evidence of pulmonic stenosis. Aorta: The aortic root is normal in size and structure. Venous: The inferior vena cava is normal in size with greater than 50% respiratory variability, suggesting right atrial pressure of 3 mmHg. IAS/Shunts: No atrial level shunt  detected by color flow Doppler.  LEFT VENTRICLE PLAX 2D LVIDd:         3.00 cm   Diastology LVIDs:         2.10 cm   LV e' medial:    8.33 cm/s LV PW:         0.90 cm   LV E/e' medial:  8.7  LV IVS:        1.00 cm   LV e' lateral:   10.90 cm/s LVOT diam:     2.00 cm   LV E/e' lateral: 6.6 LV SV:         76 LV SV Index:   47 LVOT Area:     3.14 cm  RIGHT VENTRICLE RV Basal diam:  3.50 cm RV S prime:     10.80 cm/s TAPSE (M-mode): 1.8 cm LEFT ATRIUM             Index        RIGHT ATRIUM           Index LA diam:        3.20 cm 1.96 cm/m   RA Area:     12.00 cm LA Vol (A2C):   33.9 ml 20.72 ml/m  RA Volume:   26.80 ml  16.38 ml/m LA Vol (A4C):   49.9 ml 30.50 ml/m LA Biplane Vol: 42.9 ml 26.22 ml/m  AORTIC VALVE                 PULMONIC VALVE AV Area (Vmax): 2.63 cm     PV Vmax:          0.72 m/s AV Vmax:        141.00 cm/s  PV Peak grad:     2.1 mmHg AV Peak Grad:   8.0 mmHg     PR End Diast Vel: 0.99 msec LVOT Vmax:      118.00 cm/s  RVOT Peak grad:   0 mmHg LVOT Vmean:     73.100 cm/s LVOT VTI:       0.243 m  AORTA Ao Root diam: 3.10 cm Ao Asc diam:  3.10 cm MITRAL VALVE               TRICUSPID VALVE MV Area (PHT): 3.30 cm    TR Peak grad:   19.5 mmHg MV Decel Time: 230 msec    TR Vmax:        221.00 cm/s MV E velocity: 72.20 cm/s MV A velocity: 95.20 cm/s  SHUNTS MV E/A ratio:  0.76        Systemic VTI:  0.24 m                            Systemic Diam: 2.00 cm Annabella Scarce MD Electronically signed by Annabella Scarce MD Signature Date/Time: 05/11/2024/12:07:39 PM    Final          Cynthia Blunt, DO [facility] 05/12/2024, 4:02 PM    Dictation software may have been used to generate the above note. Typos may occur and escape review in typed/dictated notes. Please contact Dr Harvey directly for clarity if needed.  Staff may message me via secure chat in Epic  but this may not receive an immediate response,  please page me for urgent matters!  If 7PM-7AM, please contact  night coverage [email or web address]

## 2024-05-12 NOTE — Plan of Care (Signed)

## 2024-05-13 DIAGNOSIS — A419 Sepsis, unspecified organism: Secondary | ICD-10-CM | POA: Diagnosis not present

## 2024-05-13 DIAGNOSIS — R6521 Severe sepsis with septic shock: Secondary | ICD-10-CM | POA: Diagnosis not present

## 2024-05-13 LAB — BASIC METABOLIC PANEL WITH GFR
Anion gap: 10 (ref 5–15)
BUN: 26 mg/dL — ABNORMAL HIGH (ref 8–23)
CO2: 23 mmol/L (ref 22–32)
Calcium: 8.9 mg/dL (ref 8.9–10.3)
Chloride: 96 mmol/L — ABNORMAL LOW (ref 98–111)
Creatinine, Ser: 6.66 mg/dL — ABNORMAL HIGH (ref 0.44–1.00)
GFR, Estimated: 6 mL/min — ABNORMAL LOW (ref >60)
Glucose, Bld: 107 mg/dL — ABNORMAL HIGH (ref 70–99)
Potassium: 5 mmol/L (ref 3.5–5.1)
Sodium: 129 mmol/L — ABNORMAL LOW (ref 135–145)

## 2024-05-13 LAB — CBC
HCT: 24.7 % — ABNORMAL LOW (ref 36.0–46.0)
Hemoglobin: 8 g/dL — ABNORMAL LOW (ref 12.0–15.0)
MCH: 31.6 pg (ref 26.0–34.0)
MCHC: 32.4 g/dL (ref 30.0–36.0)
MCV: 97.6 fL (ref 80.0–100.0)
Platelets: 256 K/uL (ref 150–400)
RBC: 2.53 MIL/uL — ABNORMAL LOW (ref 3.87–5.11)
RDW: 16.4 % — ABNORMAL HIGH (ref 11.5–15.5)
WBC: 12 K/uL — ABNORMAL HIGH (ref 4.0–10.5)
nRBC: 0 % (ref 0.0–0.2)

## 2024-05-13 LAB — MAGNESIUM: Magnesium: 2.6 mg/dL — ABNORMAL HIGH (ref 1.7–2.4)

## 2024-05-13 MED ORDER — EPOETIN ALFA-EPBX 10000 UNIT/ML IJ SOLN
10000.0000 [IU] | INTRAMUSCULAR | Status: DC
  Administered 2024-05-13 – 2024-05-15 (×2): 10000 [IU] via INTRAVENOUS
  Filled 2024-05-13 (×3): qty 2

## 2024-05-13 MED ORDER — MIDODRINE HCL 5 MG PO TABS
ORAL_TABLET | ORAL | Status: AC
  Filled 2024-05-13: qty 4

## 2024-05-13 MED ORDER — EPOETIN ALFA-EPBX 10000 UNIT/ML IJ SOLN
INTRAMUSCULAR | Status: AC
  Filled 2024-05-13: qty 1

## 2024-05-13 MED ORDER — HEPARIN SODIUM (PORCINE) 1000 UNIT/ML IJ SOLN
INTRAMUSCULAR | Status: AC
  Filled 2024-05-13: qty 3

## 2024-05-13 NOTE — TOC Initial Note (Signed)
 Transition of Care [facility]) - Initial/Assessment Note    Patient Details  Name: Cynthia Harvey MRN: [id number] Date of Birth: 1950/12/22  Transition [facility]) CM/SW Contact:    Alfonso Rummer, LCSW Phone Number: 05/13/2024, 4:31 PM  Clinical Narrative:                  [facility] Pt will return to [facility]/ [place] [facility]2 completed.    Barriers to Discharge: Continued Medical Work up   Patient Goals and CMS Choice            Expected Discharge Plan and Services       Living arrangements for the past 2 months: Skilled Nursing Facility                                      Prior Living Arrangements/Services Living arrangements for the past 2 months: Skilled Nursing Facility Lives with:: Facility Resident Patient language and need for interpreter reviewed:: Yes        Need for Family Participation in Patient Care: Yes (Comment) Care giver support system in place?: Yes (comment)   Criminal Activity/Legal Involvement Pertinent to Current Situation/Hospitalization: No - Comment as needed  Activities of Daily Living   ADL Screening (condition at time of admission) Independently performs ADLs?: No Does the patient have a NEW difficulty with bathing/dressing/toileting/self-feeding that is expected to last >3 days?: No Does the patient have a NEW difficulty with getting in/out of bed, walking, or climbing stairs that is expected to last >3 days?: No Does the patient have a NEW difficulty with communication that is expected to last >3 days?: No Is the patient deaf or have difficulty hearing?: No Does the patient have difficulty seeing, even when wearing glasses/contacts?: No Does the patient have difficulty concentrating, remembering, or making decisions?: No  Permission Sought/Granted                  Emotional Assessment Appearance:: Appears stated age Attitude/Demeanor/Rapport: Gracious Affect (typically observed): Calm Orientation: :  Oriented to Place, Oriented to  Time, Oriented to Situation, Oriented to Self Alcohol / Substance Use: Not Applicable Psych Involvement: No (comment)  Admission diagnosis:  Hypoglycemia [E16.2] Hypotension [I95.9] Sepsis (HCC) [A41.9] Hypotension, unspecified hypotension type [I95.9] Patient Active Problem List   Diagnosis Date Noted   Septic shock (HCC) 05/06/2024   Hypotension, acute on chronic 05/06/2024   Hypoglycemia 05/06/2024   ESRD on hemodialysis (HCC) 05/06/2024   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(10/11-11/3/25) 05/06/2024   History of anemia due to chronic kidney disease 05/06/2024   Type 2 diabetes mellitus with other diabetic kidney complication (HCC) 12/17/2023   Systemic lupus erythematosus (HCC) 07/12/2023   PCP:  Henry Fitch, MD Pharmacy:  No Pharmacies Listed    Social Drivers of Health (SDOH) Social History: SDOH Screenings   Food Insecurity: No Food Insecurity (05/07/2024)  Housing: Unknown (05/07/2024)  Transportation Needs: No Transportation Needs (05/07/2024)  Utilities: Not At Risk (05/07/2024)  Financial Resource Strain: Low Risk (03/14/2024)   Received from [facility]  Physical Activity: Unknown (06/01/2023)   Received from [facility]  Social Connections: Unknown (05/07/2024)  Stress: No Stress Concern Present (06/01/2023)   Received from [facility]  Tobacco Use: Low Risk  (05/06/2024)  Health Literacy: Low Risk (10/01/2020)   [facility]   SDOH Interventions:  Readmission Risk Interventions     No data to display

## 2024-05-13 NOTE — Plan of Care (Signed)
   Problem: Fluid Volume: Goal: Hemodynamic stability will improve Outcome: Progressing   Problem: Clinical Measurements: Goal: Diagnostic test results will improve Outcome: Progressing Goal: Signs and symptoms of infection will decrease Outcome: Progressing   Problem: Respiratory: Goal: Ability to maintain adequate ventilation will improve Outcome: Progressing

## 2024-05-13 NOTE — Progress Notes (Signed)
 PROGRESS NOTE    Cynthia Harvey   FMW:[id number] DOB: 11-18-1950  DOA: 05/06/2024 Date of Service: 05/13/24 which is hospital day 6  PCP: Henry Fitch, MD    Hospital course / significant events:   HPI:  Cynthia Harvey is a 73 y.o. female with medical history significant for SLE on hydroxychloroquine, complicated by ESRD recently transitioned from PD to HD during a hospitalization at [facility] (10/11 to 04/21/2024) for PD associated Pseudomonas peritonitis(meropenem-> Cipro, completed 04/28/2024), brief paroxysmal A-fib during that hospitalization (not placed on [facility]), chronic hypotension on midodrine and Florinef presenting w/ chief complaint hypotension, sent by dialysis center.   during her recent hospitalization she had recurring problems with hypotension/hypoglycemia s/p cortisol stimulation test with normal cortisol values.   11/11: admitted to hospitalist service with concern for sepsis, suspect from SBP  11/12: BP continuing to drop, transfer to ICU service for pressors. IR attempted paracentesis but no ascites visible for sampling. Crotisol level was 13.3, and this might represent mild functional adrenal insufficiency, so hydrocortisone was started.  11/13: holding off dialysis d/t low BP, remains on levo --> off later in the day. Pt c/o nausea CT Abd/Pelvis non concerning   11/14: midodrine 20 mg 3 times daily, dialysis treatment today with no UF, Albumin 25 g IV given.  No longer on vasopressors.  MAP goal reduced to 60.  SBP goal reduced to 80.  Nausea improved. 11/15: overnight hypotensive again repeat lactic acid showing some poor perfusion with an increased value of 2.9. and back to ICU on levophed. Art line inserted for close BP monitoring. It is unclear what the source of the patient's continued hypotension is, cultures remaining negative and broad spectrum antibiotics day 4 today. Off pressors again in afternoon - Arterial line revealed cuff pressures are NOT accurate pt  NOT hypotensive so vasopressors stopped.  Echo pending 11/16: remain off pressors. Dialysis today. Plan transfer to hospitalist tomorrow. Echo LVEF 55-60 no RWMA, normal diastolic, no significant valve abn 11/17: hospitalist assumes care. Art line out. Weaning down on IV steroids 11/18: stopping IV steroids this evening (last dose this AM), if BP holding anticipate medically stable tomorrow, hopefully can go back to [facility]. Will finish 7 days abx tomorrow        Consultants:  [facility] Nephrology  Interventional Radiology   Procedures/Surgeries: 11/15: art line R radial 11/15: CVC line R femoral       ASSESSMENT & PLAN:   #Circulatory shock~resolved   #Possible adrenal insufficiency  Hx: Chronic hypotension and atrial fibrillation  telemetry monitoring  Midodrine Art line in place for close BP monitoring / accurate measurement  stress dose steroids iv 100 mg q8hrs --> weaned off to q12h --> dc today  Continue po Florinef   #ESRD on HD  #Hyponatremia  #Hypomagnesia  Trend BMP  Replace electrolytes as indicated  Nephrology consulted appreciate input: HD per recommendations    #Possible peritonitis  #Nausea/vomiting~resolved   Trend WBC and monitor fever curve Repeat hepatic function panel   KUB negative  IR unable to perform paracentesis no ascites visible on US  to allow for safe approach COVID/Influenza A&B/RSV: negative    #Systemic Lupus Erythematosus  hold outpatient hydroxychloroquine for now given concern for sepsis    #Anemia without signs of active bleeding  trend CBC  Monitor for s/sx of bleeding  Transfuse for hgb <7    #Hypoglycemia  Continue diet  CBG's q4hrs --> as needed  Follow hyper/hypoglycemic protocol     Class 1 obesity  based on BMI: Body mass index is 33.49 kg/m.[name] Significantly low or high BMI is associated with higher medical risk.  Underweight - under 18  overweight - 25 to 29 obese - 30 or more Class 1 obesity: BMI of 30.0 to  34 Class 2 obesity: BMI of 35.0 to 39 Class 3 obesity: BMI of 40.0 to 49 Super Morbid Obesity: BMI 50-59 Super-super Morbid Obesity: BMI 60+ Healthy nutrition and physical activity advised as adjunct to other disease management and risk reduction treatments    DVT prophylaxis: heparin IV fluids: no continuous IV fluids  Nutrition: regular w/ fluid restriction Central lines / other devices: art line removed today, CVC in place as this is only access  Code Status: FULL DOE ACP documentation reviewed:  none on file in [facility]  TOC needs: TBD Medical barriers to dispo: weaning IV steroids. Expected medical readiness for discharge next couple days.              Subjective / Brief ROS:  Patient reports feeling well today after dialysis  Denies CP/SOB.  Pain controlled.  Denies new weakness.  Tolerating diet.    Family Communication: none at bedside     Objective Findings:  Vitals:   05/13/24 1200 05/13/24 1230 05/13/24 1328 05/13/24 1625  BP: (!) 104/40 (!) 97/44 (!) 112/59 94/70  Pulse: 70  (!) 48 83  Resp: 12 14 16 16   Temp: 98.3 F (36.8 C)  98.1 F (36.7 C) 98.3 F (36.8 C)  TempSrc: Oral     SpO2: 100%  100% 98%  Weight:      Height:        Intake/Output Summary (Last 24 hours) at 05/13/2024 1709 Last data filed at 05/13/2024 1500 Gross per 24 hour  Intake 240 ml  Output 300 ml  Net -60 ml   Filed Weights   05/12/24 0500 05/13/24 0500 05/13/24 0828  Weight: 70.2 kg 70 kg 70.8 kg    Examination:  Physical Exam Constitutional:      General: She is not in acute distress. Cardiovascular:     Rate and Rhythm: Normal rate and regular rhythm.  Pulmonary:     Effort: Pulmonary effort is normal.     Breath sounds: Normal breath sounds.  Abdominal:     Palpations: Abdomen is soft.  Musculoskeletal:     Right lower leg: No edema.     Left lower leg: No edema.  Skin:    General: Skin is warm and dry.  Neurological:     Mental Status: She is  alert and oriented to person, place, and time.  Psychiatric:        Mood and Affect: Mood normal.        Behavior: Behavior normal.          Scheduled Medications:   atorvastatin  20 mg Oral Daily   Chlorhexidine Gluconate Cloth  6 each Topical Q0600   cinacalcet  60 mg Oral Q breakfast   epoetin alfa-epbx (RETACRIT) injection  10,000 Units Intravenous Q T,Th,Sat-1800   feeding supplement (NEPRO CARB STEADY)  237 mL Oral BID BM   fludrocortisone  0.1 mg Oral Daily   fluticasone furoate-vilanterol  1 puff Inhalation Daily   gabapentin  300 mg Oral BID   heparin  5,000 Units Subcutaneous Q8H   midodrine  20 mg Oral TID WC   multivitamin  1 tablet Oral QHS   pantoprazole (PROTONIX) IV  40 mg Intravenous Q24H   psyllium  1 packet Oral  Daily    Continuous Infusions:  meropenem (MERREM) IV 500 mg (05/12/24 2218)    PRN Medications:  acetaminophen **OR** acetaminophen, dextrose, HYDROcodone-acetaminophen, loperamide, ondansetron **OR** ondansetron (ZOFRAN) IV, mouth rinse  Antimicrobials from admission:  Anti-infectives (From admission, onward)    Start     Dose/Rate Route Frequency Ordered Stop   05/09/24 1800  vancomycin (VANCOREADY) IVPB 750 mg/150 mL  Status:  Discontinued        750 mg 150 mL/hr over 60 Minutes Intravenous  Once 05/09/24 0753 05/09/24 1343   05/08/24 1800  vancomycin (VANCOREADY) IVPB 750 mg/150 mL  Status:  Discontinued        750 mg 150 mL/hr over 60 Minutes Intravenous  Once 05/08/24 0822 05/08/24 0850   05/08/24 1200  vancomycin (VANCOREADY) IVPB 750 mg/150 mL  Status:  Discontinued        750 mg 150 mL/hr over 60 Minutes Intravenous Every Dialysis 05/08/24 0822 05/08/24 0827   05/08/24 1200  vancomycin (VANCOREADY) IVPB 750 mg/150 mL  Status:  Discontinued        750 mg 150 mL/hr over 60 Minutes Intravenous Every T-Th-Sa (Hemodialysis) 05/08/24 0827 05/08/24 1131   05/07/24 2200  meropenem (MERREM) 500 mg in sodium chloride 0.9 % 100 mL IVPB         500 mg 200 mL/hr over 30 Minutes Intravenous Daily at bedtime 05/07/24 0837 05/13/24 2359   05/07/24 1300  vancomycin (VANCOREADY) IVPB 1500 mg/300 mL        1,500 mg 150 mL/hr over 120 Minutes Intravenous  Once 05/07/24 1137 05/07/24 1419   05/07/24 1000  hydroxychloroquine (PLAQUENIL) tablet 200 mg  Status:  Discontinued        200 mg Oral Daily 05/06/24 2238 05/07/24 1501   05/07/24 0845  meropenem (MERREM) 500 mg in sodium chloride 0.9 % 100 mL IVPB        500 mg 200 mL/hr over 30 Minutes Intravenous  Once 05/07/24 0834 05/07/24 1149   05/06/24 2100  metroNIDAZOLE (FLAGYL) IVPB 500 mg  Status:  Discontinued        500 mg 100 mL/hr over 60 Minutes Intravenous Every 12 hours 05/06/24 1943 05/07/24 0833   05/06/24 2030  cefTRIAXone (ROCEPHIN) 2 g in sodium chloride 0.9 % 100 mL IVPB  Status:  Discontinued        2 g 200 mL/hr over 30 Minutes Intravenous Every 24 hours 05/06/24 1943 05/07/24 9167           Data Reviewed:  I have personally reviewed the following...  CBC: Recent Labs  Lab 05/08/24 0451 05/09/24 0651 05/11/24 0404 05/12/24 0405 05/13/24 0500  WBC 15.3* 11.3* 6.9 11.7* 12.0*  NEUTROABS  --  6.9  --   --   --   HGB 9.5* 9.3* 8.5* 8.3* 8.0*  HCT 31.2* 29.9* 26.6* 25.6* 24.7*  MCV 104.3* 100.3* 99.3 97.7 97.6  PLT 279 291 270 242 256   Basic Metabolic Panel: Recent Labs  Lab 05/07/24 1418 05/08/24 0451 05/09/24 0651 05/10/24 0148 05/10/24 0434 05/11/24 0404 05/12/24 0405 05/13/24 0500  NA  --  136 137 134* 134* 129* 130* 129*  K  --  3.4* 3.7 3.2* 3.3* 5.0 5.2* 5.0  CL  --  100 101 102 99 98 99 96*  CO2  --  23 20* 23 24 21* 21* 23  GLUCOSE  --  92 76 68* 106* 104* 99 107*  BUN  --  13 19 7* 7* 10 17 26*  CREATININE  --  5.64* 6.95* 3.36* 3.73* 4.61* 5.37* 6.66*  CALCIUM  --  8.9 8.8* 7.5* 7.8* 8.2* 8.6* 8.9  MG 1.8  --  2.3 1.8  --  1.8 2.7* 2.6*  PHOS 2.0* 3.2 3.8  --   --   --   --   --    GFR: Estimated Creatinine Clearance: 6.3  mL/min (A) (by C-G formula based on SCr of 6.66 mg/dL (H)). Liver Function Tests: Recent Labs  Lab 05/07/24 1418 05/09/24 0651 05/11/24 0404  AST 45*  --  31  ALT 19  --  11  ALKPHOS 70  --  65  BILITOT 0.3  --  0.3  PROT 5.4*  --  5.4*  ALBUMIN 2.9* 2.9* 3.3*   No results for input(s): LIPASE, AMYLASE in the last 168 hours. No results for input(s): AMMONIA in the last 168 hours. Coagulation Profile: Recent Labs  Lab 05/07/24 0552  INR 1.0   Cardiac Enzymes: No results for input(s): CKTOTAL, CKMB, CKMBINDEX, TROPONINI in the last 168 hours. BNP (last 3 results) No results for input(s): PROBNP in the last 8760 hours. HbA1C: No results for input(s): HGBA1C in the last 72 hours. CBG: Recent Labs  Lab 05/11/24 1927 05/11/24 2326 05/12/24 0437 05/12/24 0734 05/12/24 1132  GLUCAP 148* 95 106* 88 124*   Lipid Profile: No results for input(s): CHOL, HDL, LDLCALC, TRIG, CHOLHDL, LDLDIRECT in the last 72 hours. Thyroid  Function Tests: No results for input(s): TSH, T4TOTAL, FREET4, T3FREE, THYROIDAB in the last 72 hours. Anemia Panel: No results for input(s): VITAMINB12, FOLATE, FERRITIN, TIBC, IRON, RETICCTPCT in the last 72 hours. Most Recent Urinalysis On File:  No results found for: COLORURINE, APPEARANCEUR, LABSPEC, PHURINE, GLUCOSEU, HGBUR, BILIRUBINUR, KETONESUR, PROTEINUR, UROBILINOGEN, NITRITE, LEUKOCYTESUR Sepsis Labs: @LABRCNTIP (procalcitonin:4,lacticidven:4) Microbiology: Recent Results (from the past 240 hours)  Blood culture (single)     Status: None   Collection Time: 05/06/24 11:05 PM   Specimen: BLOOD  Result Value Ref Range Status   Specimen Description BLOOD BLOOD RIGHT HAND  Final   Special Requests   Final    BOTTLES DRAWN AEROBIC AND ANAEROBIC Blood Culture adequate volume   Culture   Final    NO GROWTH 5 DAYS Performed at [facility], [address],  [place], [place] [address]    Report Status 05/11/2024 FINAL  Final  MRSA Next Gen by PCR, Nasal     Status: None   Collection Time: 05/07/24  9:37 AM   Specimen: Nasal Mucosa; Nasal Swab  Result Value Ref Range Status   MRSA by PCR Next Gen NOT DETECTED NOT DETECTED Final    Comment: (NOTE) The GeneXpert MRSA Assay (FDA approved for NASAL specimens only), is one component of a comprehensive MRSA colonization surveillance program. It is not intended to diagnose MRSA infection nor to guide or monitor treatment for MRSA infections. Test performance is not FDA approved in patients less than 41 years old. Performed at [facility], [address] [place], [place] [address]   Culture, blood (single) w Reflex to ID Panel     Status: None   Collection Time: 05/07/24 11:49 AM   Specimen: BLOOD  Result Value Ref Range Status   Specimen Description BLOOD BLOOD LEFT HAND  Final   Special Requests   Final    BOTTLES DRAWN AEROBIC AND ANAEROBIC Blood Culture adequate volume   Culture   Final    NO GROWTH 5 DAYS Performed at [facility], [address] [place], [place] [address]    Report Status 05/12/2024 FINAL  Final  Resp panel by RT-PCR (RSV, Flu A&B, Covid) Anterior Nasal Swab     Status: None   Collection Time: 05/07/24  3:12 PM   Specimen: Anterior Nasal Swab  Result Value Ref Range Status   SARS Coronavirus 2 by RT PCR NEGATIVE NEGATIVE Final    Comment: (NOTE) SARS-CoV-2 target nucleic acids are NOT DETECTED.  The SARS-CoV-2 RNA is generally detectable in upper respiratory specimens during the acute phase of infection. The lowest concentration of SARS-CoV-2 viral copies this assay can detect is 138 copies/mL. A negative result does not preclude SARS-Cov-2 infection and should not be used as the sole basis for treatment or other patient management decisions. A negative result may occur with  improper specimen collection/handling, submission of specimen  other than nasopharyngeal swab, presence of viral mutation(s) within the areas targeted by this assay, and inadequate number of viral copies(<138 copies/mL). A negative result must be combined with clinical observations, patient history, and epidemiological information. The expected result is Negative.  Fact Sheet for Patients:  bloggercourse.com  Fact Sheet for Healthcare Providers:  seriousbroker.it  This test is no t yet approved or cleared by the [place]  FDA and  has been authorized for detection and/or diagnosis of SARS-CoV-2 by FDA under an Emergency Use Authorization (EUA). This EUA will remain  in effect (meaning this test can be used) for the duration of the COVID-19 declaration under Section 564(b)(1) of the Act, 21 U.S.C.section 360bbb-3(b)(1), unless the authorization is terminated  or revoked sooner.       Influenza A by PCR NEGATIVE NEGATIVE Final   Influenza B by PCR NEGATIVE NEGATIVE Final    Comment: (NOTE) The Xpert Xpress SARS-CoV-2/FLU/RSV plus assay is intended as an aid in the diagnosis of influenza from Nasopharyngeal swab specimens and should not be used as a sole basis for treatment. Nasal washings and aspirates are unacceptable for Xpert Xpress SARS-CoV-2/FLU/RSV testing.  Fact Sheet for Patients: bloggercourse.com  Fact Sheet for Healthcare Providers: seriousbroker.it  This test is not yet approved or cleared by the [place]  FDA and has been authorized for detection and/or diagnosis of SARS-CoV-2 by FDA under an Emergency Use Authorization (EUA). This EUA will remain in effect (meaning this test can be used) for the duration of the COVID-19 declaration under Section 564(b)(1) of the Act, 21 U.S.C. section 360bbb-3(b)(1), unless the authorization is terminated or revoked.     Resp Syncytial Virus by PCR NEGATIVE NEGATIVE Final     Comment: (NOTE) Fact Sheet for Patients: bloggercourse.com  Fact Sheet for Healthcare Providers: seriousbroker.it  This test is not yet approved or cleared by the [place]  FDA and has been authorized for detection and/or diagnosis of SARS-CoV-2 by FDA under an Emergency Use Authorization (EUA). This EUA will remain in effect (meaning this test can be used) for the duration of the COVID-19 declaration under Section 564(b)(1) of the Act, 21 U.S.C. section 360bbb-3(b)(1), unless the authorization is terminated or revoked.  Performed at [facility], [address] [place], [place] [address]       Radiology Studies last 3 days: ECHOCARDIOGRAM COMPLETE Result Date: 05/11/2024    ECHOCARDIOGRAM REPORT   Patient Name:   MARQUERITE FORSMAN [facility] Date of Exam: 05/11/2024 Medical Rec #:  [id number]          Height:       57.0 in Accession #:    [id number]  Weight:       160.1 lb Date of Birth:  1951/04/10          BSA:          1.636 m Patient Age:    73 years           BP:           148/114 mmHg Patient Gender: F                  HR:           70 bpm. Exam Location:  [facility] Procedure: 2D Echo, Cardiac Doppler and Color Doppler (Both Spectral and Color            Flow Doppler were utilized during procedure). Indications:     Shock R57.9  History:         Patient has no prior history of Echocardiogram examinations.  Sonographer:     Bernice Rubinstein RDCS Referring Phys:  [id number] KHABIB DGAYLI Diagnosing Phys: Annabella Scarce MD  Sonographer Comments: Image acquisition challenging due to respiratory motion. IMPRESSIONS  1. Left ventricular ejection fraction, by estimation, is 55 to 60%. The left ventricle has normal function. The left ventricle has no regional wall motion abnormalities. Left ventricular diastolic parameters were normal.  2. Right ventricular systolic function is normal. The right ventricular size is normal. There is normal  pulmonary artery systolic pressure.  3. Left atrial size was mildly dilated.  4. The mitral valve is normal in structure. Mild mitral valve regurgitation. No evidence of mitral stenosis.  5. The aortic valve is tricuspid. Aortic valve regurgitation is mild to moderate. No aortic stenosis is present.  6. The inferior vena cava is normal in size with greater than 50% respiratory variability, suggesting right atrial pressure of 3 mmHg. FINDINGS  Left Ventricle: Left ventricular ejection fraction, by estimation, is 55 to 60%. The left ventricle has normal function. The left ventricle has no regional wall motion abnormalities. The left ventricular internal cavity size was normal in size. There is  no left ventricular hypertrophy. Left ventricular diastolic parameters were normal. Normal left ventricular filling pressure. Right Ventricle: The right ventricular size is normal. No increase in right ventricular wall thickness. Right ventricular systolic function is normal. There is normal pulmonary artery systolic pressure. The tricuspid regurgitant velocity is 2.21 m/s, and  with an assumed right atrial pressure of 3 mmHg, the estimated right ventricular systolic pressure is 22.5 mmHg. Left Atrium: Left atrial size was mildly dilated. Right Atrium: Right atrial size was normal in size. Pericardium: There is no evidence of pericardial effusion. Mitral Valve: The mitral valve is normal in structure. Mild mitral valve regurgitation. No evidence of mitral valve stenosis. Tricuspid Valve: The tricuspid valve is normal in structure. Tricuspid valve regurgitation is mild . No evidence of tricuspid stenosis. Aortic Valve: The aortic valve is tricuspid. Aortic valve regurgitation is mild to moderate. No aortic stenosis is present. Aortic valve peak gradient measures 8.0 mmHg. Pulmonic Valve: The pulmonic valve was normal in structure. Pulmonic valve regurgitation is trivial. No evidence of pulmonic stenosis. Aorta: The aortic root  is normal in size and structure. Venous: The inferior vena cava is normal in size with greater than 50% respiratory variability, suggesting right atrial pressure of 3 mmHg. IAS/Shunts: No atrial level shunt detected by color flow Doppler.  LEFT VENTRICLE PLAX 2D LVIDd:         3.00 cm   Diastology LVIDs:  2.10 cm   LV e' medial:    8.33 cm/s LV PW:         0.90 cm   LV E/e' medial:  8.7 LV IVS:        1.00 cm   LV e' lateral:   10.90 cm/s LVOT diam:     2.00 cm   LV E/e' lateral: 6.6 LV SV:         76 LV SV Index:   47 LVOT Area:     3.14 cm  RIGHT VENTRICLE RV Basal diam:  3.50 cm RV S prime:     10.80 cm/s TAPSE (M-mode): 1.8 cm LEFT ATRIUM             Index        RIGHT ATRIUM           Index LA diam:        3.20 cm 1.96 cm/m   RA Area:     12.00 cm LA Vol (A2C):   33.9 ml 20.72 ml/m  RA Volume:   26.80 ml  16.38 ml/m LA Vol (A4C):   49.9 ml 30.50 ml/m LA Biplane Vol: 42.9 ml 26.22 ml/m  AORTIC VALVE                 PULMONIC VALVE AV Area (Vmax): 2.63 cm     PV Vmax:          0.72 m/s AV Vmax:        141.00 cm/s  PV Peak grad:     2.1 mmHg AV Peak Grad:   8.0 mmHg     PR End Diast Vel: 0.99 msec LVOT Vmax:      118.00 cm/s  RVOT Peak grad:   0 mmHg LVOT Vmean:     73.100 cm/s LVOT VTI:       0.243 m  AORTA Ao Root diam: 3.10 cm Ao Asc diam:  3.10 cm MITRAL VALVE               TRICUSPID VALVE MV Area (PHT): 3.30 cm    TR Peak grad:   19.5 mmHg MV Decel Time: 230 msec    TR Vmax:        221.00 cm/s MV E velocity: 72.20 cm/s MV A velocity: 95.20 cm/s  SHUNTS MV E/A ratio:  0.76        Systemic VTI:  0.24 m                            Systemic Diam: 2.00 cm Annabella Scarce MD Electronically signed by Annabella Scarce MD Signature Date/Time: 05/11/2024/12:07:39 PM    Final          Laneta Blunt, DO [facility] 05/13/2024, 5:09 PM    Dictation software may have been used to generate the above note. Typos may occur and escape review in typed/dictated notes. Please contact Dr  Blunt directly for clarity if needed.  Staff may message me via secure chat in Epic  but this may not receive an immediate response,  please page me for urgent matters!  If 7PM-7AM, please contact night coverage [email or web address]

## 2024-05-13 NOTE — Progress Notes (Signed)
 [facility]  ROUNDING NOTE   Subjective:   Patient seen and evaluated during dialysis   HEMODIALYSIS FLOWSHEET:  Blood Flow Rate (mL/min): 400 mL/min Arterial Pressure (mmHg): -206.86 mmHg Venous Pressure (mmHg): 178.78 mmHg TMP (mmHg): 7.27 mmHg Ultrafiltration Rate (mL/min): 371 mL/min Dialysate Flow Rate (mL/min): 299 ml/min Dialysis Fluid Bolus: Normal Saline Bolus Amount (mL): 100 mL  No complaints at this time Room air  Objective:  Vital signs in last 24 hours:  Temp:  [97.5 F (36.4 C)-98.1 F (36.7 C)] 97.8 F (36.6 C) (11/18 0823) Pulse Rate:  [56-89] 70 (11/18 0930) Resp:  [11-23] 15 (11/18 0930) BP: (88-139)/(46-76) 98/62 (11/18 0930) SpO2:  [99 %-100 %] 100 % (11/18 0930) Weight:  [70 kg-70.8 kg] 70.8 kg (11/18 0828)  Weight change: -0.2 kg Filed Weights   05/12/24 0500 05/13/24 0500 05/13/24 0828  Weight: 70.2 kg 70 kg 70.8 kg    Intake/Output: I/O last 3 completed shifts: In: 940 [P.O.:840; IV Piggyback:100] Out: 0    Intake/Output this shift:  No intake/output data recorded.  Physical Exam: General: NAD  Head: Normocephalic, atraumatic. Moist oral mucosal membranes  Eyes: Anicteric  Lungs:  Clear to auscultation, normal effort  Heart: Regular rate and rhythm  Abdomen:  Soft, nontender  Extremities:  No peripheral edema.  Neurologic: Awake, alert, conversant  Skin: Warm,dry, no rash  Access: Rt internal jugular permcath    Basic Metabolic Panel: Recent Labs  Lab 05/07/24 1418 05/08/24 0451 05/09/24 0651 05/10/24 0148 05/10/24 0434 05/11/24 0404 05/12/24 0405 05/13/24 0500  NA  --  136 137 134* 134* 129* 130* 129*  K  --  3.4* 3.7 3.2* 3.3* 5.0 5.2* 5.0  CL  --  100 101 102 99 98 99 96*  CO2  --  23 20* 23 24 21* 21* 23  GLUCOSE  --  92 76 68* 106* 104* 99 107*  BUN  --  13 19 7* 7* 10 17 26*  CREATININE  --  5.64* 6.95* 3.36* 3.73* 4.61* 5.37* 6.66*  CALCIUM  --  8.9 8.8* 7.5* 7.8* 8.2* 8.6* 8.9  MG 1.8  --   2.3 1.8  --  1.8 2.7* 2.6*  PHOS 2.0* 3.2 3.8  --   --   --   --   --     Liver Function Tests: Recent Labs  Lab 05/06/24 1509 05/07/24 1418 05/09/24 0651 05/11/24 0404  AST 65* 45*  --  31  ALT 20 19  --  11  ALKPHOS 84 70  --  65  BILITOT 0.4 0.3  --  0.3  PROT 7.2 5.4*  --  5.4*  ALBUMIN 4.0 2.9* 2.9* 3.3*   No results for input(s): LIPASE, AMYLASE in the last 168 hours. No results for input(s): AMMONIA in the last 168 hours.  CBC: Recent Labs  Lab 05/06/24 1509 05/07/24 0552 05/08/24 0451 05/09/24 0651 05/11/24 0404 05/12/24 0405 05/13/24 0500  WBC 15.7*   < > 15.3* 11.3* 6.9 11.7* 12.0*  NEUTROABS 13.3*  --   --  6.9  --   --   --   HGB 10.9*   < > 9.5* 9.3* 8.5* 8.3* 8.0*  HCT 35.7*   < > 31.2* 29.9* 26.6* 25.6* 24.7*  MCV 102.3*   < > 104.3* 100.3* 99.3 97.7 97.6  PLT 347   < > 279 291 270 242 256   < > = values in this interval not displayed.    Cardiac Enzymes: No results  for input(s): CKTOTAL, CKMB, CKMBINDEX, TROPONINI in the last 168 hours.  BNP: Invalid input(s): POCBNP  CBG: Recent Labs  Lab 05/11/24 1927 05/11/24 2326 05/12/24 0437 05/12/24 0734 05/12/24 1132  GLUCAP 148* 95 106* 88 124*    Microbiology: Results for orders placed or performed during the hospital encounter of 05/06/24  Blood culture (single)     Status: None   Collection Time: 05/06/24 11:05 PM   Specimen: BLOOD  Result Value Ref Range Status   Specimen Description BLOOD BLOOD RIGHT HAND  Final   Special Requests   Final    BOTTLES DRAWN AEROBIC AND ANAEROBIC Blood Culture adequate volume   Culture   Final    NO GROWTH 5 DAYS Performed at [facility], [address], [place], [place] [address]    Report Status 05/11/2024 FINAL  Final  MRSA Next Gen by PCR, Nasal     Status: None   Collection Time: 05/07/24  9:37 AM   Specimen: Nasal Mucosa; Nasal Swab  Result Value Ref Range Status   MRSA by PCR Next Gen NOT DETECTED NOT DETECTED Final     Comment: (NOTE) The GeneXpert MRSA Assay (FDA approved for NASAL specimens only), is one component of a comprehensive MRSA colonization surveillance program. It is not intended to diagnose MRSA infection nor to guide or monitor treatment for MRSA infections. Test performance is not FDA approved in patients less than 80 years old. Performed at [facility], [address] [place], [place] [address]   Culture, blood (single) w Reflex to ID Panel     Status: None   Collection Time: 05/07/24 11:49 AM   Specimen: BLOOD  Result Value Ref Range Status   Specimen Description BLOOD BLOOD LEFT HAND  Final   Special Requests   Final    BOTTLES DRAWN AEROBIC AND ANAEROBIC Blood Culture adequate volume   Culture   Final    NO GROWTH 5 DAYS Performed at [facility], [address] [place], [place] [address]    Report Status 05/12/2024 FINAL  Final  Resp panel by RT-PCR (RSV, Flu A&B, Covid) Anterior Nasal Swab     Status: None   Collection Time: 05/07/24  3:12 PM   Specimen: Anterior Nasal Swab  Result Value Ref Range Status   SARS Coronavirus 2 by RT PCR NEGATIVE NEGATIVE Final    Comment: (NOTE) SARS-CoV-2 target nucleic acids are NOT DETECTED.  The SARS-CoV-2 RNA is generally detectable in upper respiratory specimens during the acute phase of infection. The lowest concentration of SARS-CoV-2 viral copies this assay can detect is 138 copies/mL. A negative result does not preclude SARS-Cov-2 infection and should not be used as the sole basis for treatment or other patient management decisions. A negative result may occur with  improper specimen collection/handling, submission of specimen other than nasopharyngeal swab, presence of viral mutation(s) within the areas targeted by this assay, and inadequate number of viral copies(<138 copies/mL). A negative result must be combined with clinical observations, patient history, and epidemiological information. The  expected result is Negative.  Fact Sheet for Patients:  bloggercourse.com  Fact Sheet for Healthcare Providers:  seriousbroker.it  This test is no t yet approved or cleared by the [place]  FDA and  has been authorized for detection and/or diagnosis of SARS-CoV-2 by FDA under an Emergency Use Authorization (EUA). This EUA will remain  in effect (meaning this test can be used) for the duration of the COVID-19 declaration under Section 564(b)(1) of the  Act, 21 U.S.C.section 360bbb-3(b)(1), unless the authorization is terminated  or revoked sooner.       Influenza A by PCR NEGATIVE NEGATIVE Final   Influenza B by PCR NEGATIVE NEGATIVE Final    Comment: (NOTE) The Xpert Xpress SARS-CoV-2/FLU/RSV plus assay is intended as an aid in the diagnosis of influenza from Nasopharyngeal swab specimens and should not be used as a sole basis for treatment. Nasal washings and aspirates are unacceptable for Xpert Xpress SARS-CoV-2/FLU/RSV testing.  Fact Sheet for Patients: bloggercourse.com  Fact Sheet for Healthcare Providers: seriousbroker.it  This test is not yet approved or cleared by the [place]  FDA and has been authorized for detection and/or diagnosis of SARS-CoV-2 by FDA under an Emergency Use Authorization (EUA). This EUA will remain in effect (meaning this test can be used) for the duration of the COVID-19 declaration under Section 564(b)(1) of the Act, 21 U.S.C. section 360bbb-3(b)(1), unless the authorization is terminated or revoked.     Resp Syncytial Virus by PCR NEGATIVE NEGATIVE Final    Comment: (NOTE) Fact Sheet for Patients: bloggercourse.com  Fact Sheet for Healthcare Providers: seriousbroker.it  This test is not yet approved or cleared by the [place]  FDA and has been authorized for detection and/or  diagnosis of SARS-CoV-2 by FDA under an Emergency Use Authorization (EUA). This EUA will remain in effect (meaning this test can be used) for the duration of the COVID-19 declaration under Section 564(b)(1) of the Act, 21 U.S.C. section 360bbb-3(b)(1), unless the authorization is terminated or revoked.  Performed at [facility], [address] [place], [place] [address]     Coagulation Studies: No results for input(s): LABPROT, INR in the last 72 hours.   Urinalysis: No results for input(s): COLORURINE, LABSPEC, PHURINE, GLUCOSEU, HGBUR, BILIRUBINUR, KETONESUR, PROTEINUR, UROBILINOGEN, NITRITE, LEUKOCYTESUR in the last 72 hours.  Invalid input(s): APPERANCEUR    Imaging: No results found.    Medications:    meropenem (MERREM) IV 500 mg (05/12/24 2218)    atorvastatin  20 mg Oral Daily   Chlorhexidine Gluconate Cloth  6 each Topical Q0600   cinacalcet  60 mg Oral Q breakfast   feeding supplement (NEPRO CARB STEADY)  237 mL Oral BID BM   fludrocortisone  0.1 mg Oral Daily   fluticasone furoate-vilanterol  1 puff Inhalation Daily   gabapentin  300 mg Oral BID   heparin  5,000 Units Subcutaneous Q8H   hydrocortisone sod succinate (SOLU-CORTEF) inj  100 mg Intravenous Q12H   midodrine  20 mg Oral TID WC   multivitamin  1 tablet Oral QHS   pantoprazole (PROTONIX) IV  40 mg Intravenous Q24H   psyllium  1 packet Oral Daily   acetaminophen **OR** acetaminophen, dextrose, HYDROcodone-acetaminophen, loperamide, ondansetron **OR** ondansetron (ZOFRAN) IV, mouth rinse  Assessment/ Plan:  Ms. Cynthia Harvey is a 73 y.o.  female  with past medical history of SLE, pAfib, chronic hypotension and end stage renal disease on hemodialysis, who was admitted to [facility] on 05/06/2024 for Hypoglycemia [E16.2] Hypotension [I95.9] Sepsis (HCC) [A41.9] Hypotension, unspecified hypotension type [I95.9]   End-stage renal disease on hemodialysis.  Receiving dialysis today, UF 1L as tolerated. Next treatment scheduled for Thursday    2. Anemia of chronic kidney disease Lab Results  Component Value Date   HGB 8.0 (L) 05/13/2024  Patient receives Mircera at outpatient clinic. Hgb reduced. Will order Retacrit 10000 units IV with dialysis.   3. Secondary Hyperparathyroidism: with outpatient labs: PTH 366, phosphorus 4.5, calcium 8.6 on 11//25  Lab Results  Component Value Date   CALCIUM 8.9 05/13/2024   PHOS 3.8 05/09/2024    Patient prescribed calcitriol, cholecalciferol, Sensipar, and sevelamer outpatient. Continue Sensipar.   4. Hypotension likely secondary to sepsis. Suspected bacterial peritonitis . Recent PD catheter removal. Elevated white count and lactic acid. Remains on meropenem.  Prescribed midodrine   LOS: 6 Elliannah Wayment 11/18/202510:36 AM

## 2024-05-13 NOTE — Progress Notes (Signed)
   05/13/24 1200  Vitals  Temp 98.3 F (36.8 C)  Temp Source Oral  BP (!) 104/40  MAP (mmHg) (!) 57  BP Location Right Arm  BP Method Automatic  Patient Position (if appropriate) Lying  Pulse Rate 70  Pulse Rate Source Monitor  ECG Heart Rate 66  Resp 12  Oxygen Therapy  SpO2 100 %  O2 Device Room Air  Patient Activity (if Appropriate) In bed  Pulse Oximetry Type Continuous  During Treatment Monitoring  Blood Flow Rate (mL/min) 349 mL/min  Arterial Pressure (mmHg) -126.46 mmHg  Venous Pressure (mmHg) 149.69 mmHg  TMP (mmHg) 4.64 mmHg  Ultrafiltration Rate (mL/min) 0 mL/min  Dialysate Flow Rate (mL/min) 300 ml/min  Dialysate Potassium Concentration 2  Dialysate Calcium Concentration 2.5  Duration of HD Treatment -hour(s) 3.17 hour(s)  Cumulative Fluid Removed (mL) per Treatment  338.44  HD Safety Checks Performed Yes  Intra-Hemodialysis Comments Progressing as prescribed;Tx completed  Post Treatment  Dialyzer Clearance Lightly streaked  Hemodialysis Intake (mL) 0 mL  Liters Processed 75.8  Fluid Removed (mL) 300 mL  Tolerated HD Treatment Yes  Hemodialysis Catheter Right Subclavian  No placement date or time found.   Orientation: Right  Access Location: Subclavian  Site Condition No complications  Blue Lumen Status Infusing  Red Lumen Status Infusing  Purple Lumen Status N/A  Catheter fill solution Heparin 1000 units/ml  Catheter fill volume (Arterial) 1.6 cc  Catheter fill volume (Venous) 1.6  Dressing Type Transparent  Dressing Status Antimicrobial disc/dressing in place;Clean, Dry, Intact  Interventions New dressing  Drainage Description None  Dressing Change Due 05/20/24  Post treatment catheter status Capped and Clamped   Pt tolerated tx well.Meds given Epo, Midodrine. Unable to get 1L off due to low bp.

## 2024-05-13 NOTE — NC FL2 (Signed)
 [place]  MEDICAID FL2 LEVEL OF CARE FORM     IDENTIFICATION  Patient Name: Cynthia Harvey Birthdate: [birth year] Sex: female Admission Date (Current Location): 05/06/2024  [facility] and [place] Number:  Chiropodist and Address:  [facility], [address], [place], [place] [address]      Provider Number: [id number]  Attending Physician Name and Address:  Marsa Edelman, DO  Relative Name and Phone Number:  JERONA CORDELLA HERO [phone]    Current Level of Care: SNF Recommended Level of Care: Skilled Nursing Facility Prior Approval Number:    Date Approved/Denied:   PASRR Number: [id number]  Discharge Plan: SNF    Current Diagnoses: Patient Active Problem List   Diagnosis Date Noted   Septic shock (HCC) 05/06/2024   Hypotension, acute on chronic 05/06/2024   Hypoglycemia 05/06/2024   ESRD on hemodialysis (HCC) 05/06/2024   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(10/11-11/3/25) 05/06/2024   History of anemia due to chronic kidney disease 05/06/2024   Type 2 diabetes mellitus with other diabetic kidney complication (HCC) 12/17/2023   Systemic lupus erythematosus (HCC) 07/12/2023    Orientation RESPIRATION BLADDER Height & Weight     Self, Time, Situation, Place  Normal Continent Weight: 156 lb 1.4 oz (70.8 kg) Height:  4' 9 (144.8 cm)  BEHAVIORAL SYMPTOMS/MOOD NEUROLOGICAL BOWEL NUTRITION STATUS      Continent Diet (Diet regular Fluid consistency: Thin; Fluid restriction: 1200 mL Fluid: General starting at 11/15 0931)  AMBULATORY STATUS COMMUNICATION OF NEEDS Skin       Normal                       Personal Care Assistance Level of Assistance  Bathing, Feeding, Dressing Bathing Assistance: Limited assistance Feeding assistance: Limited assistance Dressing Assistance: Limited assistance     Functional Limitations Info             SPECIAL CARE FACTORS FREQUENCY  PT  (By licensed PT), OT (By licensed OT)     PT Frequency: 5x OT Frequency: 5x            Contractures Contractures Info: Not present    Additional Factors Info                  Current Medications (05/13/2024):  This is the current hospital active medication list Current Facility-Administered Medications  Medication Dose Route Frequency Provider Last Rate Last Admin   acetaminophen (TYLENOL) tablet 650 mg  650 mg Oral Q6H PRN Duncan, Hazel V, MD       Or   acetaminophen (TYLENOL) suppository 650 mg  650 mg Rectal Q6H PRN Duncan, Hazel V, MD       atorvastatin (LIPITOR) tablet 20 mg  20 mg Oral Daily Duncan, Hazel V, MD   20 mg at 05/13/24 1339   Chlorhexidine Gluconate Cloth 2 % PADS 6 each  6 each Topical Q0600 Isadora Hose, MD   6 each at 05/13/24 0506   cinacalcet (SENSIPAR) tablet 60 mg  60 mg Oral Q breakfast Duncan, Hazel V, MD   60 mg at 05/13/24 1339   dextrose 50 % solution 50 mL  1 ampule Intravenous PRN Sreenath, Sudheer B, MD       epoetin alfa-epbx (RETACRIT) injection 10,000 Units  10,000 Units Intravenous Q T,Th,Sat-1800 Breeze, Faith, NP   10,000 Units at 05/13/24 1112   feeding supplement (NEPRO CARB STEADY) liquid 237 mL  237 mL Oral BID BM Dgayli, Khabib,  MD   237 mL at 05/13/24 1340   fludrocortisone (FLORINEF) tablet 0.1 mg  0.1 mg Oral Daily Alexander, Natalie, DO   0.1 mg at 05/13/24 1339   fluticasone furoate-vilanterol (BREO ELLIPTA) 100-25 MCG/ACT 1 puff  1 puff Inhalation Daily Cleatus Delayne GAILS, MD   1 puff at 05/13/24 1341   gabapentin (NEURONTIN) capsule 300 mg  300 mg Oral BID Duncan, Hazel V, MD   300 mg at 05/13/24 1341   heparin injection 5,000 Units  5,000 Units Subcutaneous Q8H Duncan, Hazel V, MD   5,000 Units at 05/13/24 1341   HYDROcodone-acetaminophen (NORCO/VICODIN) 5-325 MG per tablet 1-2 tablet  1-2 tablet Oral Q4H PRN Duncan, Hazel V, MD   2 tablet at 05/10/24 1233   loperamide (IMODIUM) capsule 2 mg  2 mg Oral PRN Sreenath, Sudheer  B, MD   2 mg at 05/11/24 0909   meropenem (MERREM) 500 mg in sodium chloride 0.9 % 100 mL IVPB  500 mg Intravenous QHS Dgayli, Belva, MD 200 mL/hr at 05/12/24 2218 500 mg at 05/12/24 2218   midodrine (PROAMATINE) tablet 20 mg  20 mg Oral TID WC Duncan, Hazel V, MD   20 mg at 05/13/24 1058   multivitamin (RENA-VIT) tablet 1 tablet  1 tablet Oral QHS Isadora Belva, MD   1 tablet at 05/12/24 2045   ondansetron (ZOFRAN) tablet 4 mg  4 mg Oral Q6H PRN Duncan, Hazel V, MD       Or   ondansetron (ZOFRAN) injection 4 mg  4 mg Intravenous Q6H PRN Duncan, Hazel V, MD   4 mg at 05/09/24 9177   Oral care mouth rinse  15 mL Mouth Rinse PRN Isadora Belva, MD       pantoprazole (PROTONIX) injection 40 mg  40 mg Intravenous Q24H Nelson, Dana G, NP   40 mg at 05/12/24 1743   psyllium (HYDROCIL/METAMUCIL) 1 packet  1 packet Oral Daily Sreenath, Sudheer B, MD   1 packet at 05/13/24 1340     Discharge Medications: Please see discharge summary for a list of discharge medications.  Relevant Imaging Results:  Relevant Lab Results:   Additional Information [id number]  Alfonso Rummer, LCSW

## 2024-05-14 DIAGNOSIS — A419 Sepsis, unspecified organism: Secondary | ICD-10-CM | POA: Diagnosis not present

## 2024-05-14 DIAGNOSIS — R6521 Severe sepsis with septic shock: Secondary | ICD-10-CM | POA: Diagnosis not present

## 2024-05-14 LAB — GLUCOSE, CAPILLARY: Glucose-Capillary: 124 mg/dL — ABNORMAL HIGH (ref 70–99)

## 2024-05-14 LAB — ALBUMIN: Albumin: 3.2 g/dL — ABNORMAL LOW (ref 3.5–5.0)

## 2024-05-14 MED ORDER — GABAPENTIN 300 MG PO CAPS: 300.0000 mg | ORAL_CAPSULE | Freq: Every day | ORAL | Status: DC

## 2024-05-14 NOTE — Progress Notes (Signed)
 [facility]  ROUNDING NOTE   Subjective:   Patient sitting up in bed Partially completed breakfast tray Room air No complaints to offer  Objective:  Vital signs in last 24 hours:  Temp:  [97.9 F (36.6 C)-98.3 F (36.8 C)] 98 F (36.7 C) (11/19 1110) Pulse Rate:  [53-83] 72 (11/19 1110) Resp:  [16-20] 18 (11/19 1110) BP: (94-128)/(44-70) 101/51 (11/19 1110) SpO2:  [98 %-100 %] 100 % (11/19 1110) Weight:  [70.6 kg] 70.6 kg (11/19 0500)  Weight change: 0.8 kg Filed Weights   05/13/24 0500 05/13/24 0828 05/14/24 0500  Weight: 70 kg 70.8 kg 70.6 kg    Intake/Output: I/O last 3 completed shifts: In: 240 [P.O.:240] Out: 300 [Other:300]   Intake/Output this shift:  Total I/O In: 480 [P.O.:480] Out: -   Physical Exam: General: NAD  Head: Normocephalic, atraumatic. Moist oral mucosal membranes  Eyes: Anicteric  Lungs:  Clear to auscultation, normal effort  Heart: Regular rate and rhythm  Abdomen:  Soft, nontender  Extremities:  No peripheral edema.  Neurologic: Awake, alert, conversant  Skin: Warm,dry, no rash  Access: Rt internal jugular permcath    Basic Metabolic Panel: Recent Labs  Lab 05/08/24 0451 05/09/24 0651 05/10/24 0148 05/10/24 0434 05/11/24 0404 05/12/24 0405 05/13/24 0500  NA 136 137 134* 134* 129* 130* 129*  K 3.4* 3.7 3.2* 3.3* 5.0 5.2* 5.0  CL 100 101 102 99 98 99 96*  CO2 23 20* 23 24 21* 21* 23  GLUCOSE 92 76 68* 106* 104* 99 107*  BUN 13 19 7* 7* 10 17 26*  CREATININE 5.64* 6.95* 3.36* 3.73* 4.61* 5.37* 6.66*  CALCIUM  8.9 8.8* 7.5* 7.8* 8.2* 8.6* 8.9  MG  --  2.3 1.8  --  1.8 2.7* 2.6*  PHOS 3.2 3.8  --   --   --   --   --     Liver Function Tests: Recent Labs  Lab 05/09/24 0651 05/11/24 0404 05/14/24 1027  AST  --  31  --   ALT  --  11  --   ALKPHOS  --  65  --   BILITOT  --  0.3  --   PROT  --  5.4*  --   ALBUMIN  2.9* 3.3* 3.2*   No results for input(s): LIPASE, AMYLASE in the last 168 hours. No  results for input(s): AMMONIA in the last 168 hours.  CBC: Recent Labs  Lab 05/08/24 0451 05/09/24 0651 05/11/24 0404 05/12/24 0405 05/13/24 0500  WBC 15.3* 11.3* 6.9 11.7* 12.0*  NEUTROABS  --  6.9  --   --   --   HGB 9.5* 9.3* 8.5* 8.3* 8.0*  HCT 31.2* 29.9* 26.6* 25.6* 24.7*  MCV 104.3* 100.3* 99.3 97.7 97.6  PLT 279 291 270 242 256    Cardiac Enzymes: No results for input(s): CKTOTAL, CKMB, CKMBINDEX, TROPONINI in the last 168 hours.  BNP: Invalid input(s): POCBNP  CBG: Recent Labs  Lab 05/11/24 1927 05/11/24 2326 05/12/24 0437 05/12/24 0734 05/12/24 1132  GLUCAP 148* 95 106* 88 124*    Microbiology: Results for orders placed or performed during the hospital encounter of 05/06/24  Blood culture (single)     Status: None   Collection Time: 05/06/24 11:05 PM   Specimen: BLOOD  Result Value Ref Range Status   Specimen Description BLOOD BLOOD RIGHT HAND  Final   Special Requests   Final    BOTTLES DRAWN AEROBIC AND ANAEROBIC Blood Culture adequate volume   Culture  Final    NO GROWTH 5 DAYS Performed at [facility], [address] [place], [place] [address]    Report Status 05/11/2024 FINAL  Final  MRSA Next Gen by PCR, Nasal     Status: None   Collection Time: 05/07/24  9:37 AM   Specimen: Nasal Mucosa; Nasal Swab  Result Value Ref Range Status   MRSA by PCR Next Gen NOT DETECTED NOT DETECTED Final    Comment: (NOTE) The GeneXpert MRSA Assay (FDA approved for NASAL specimens only), is one component of a comprehensive MRSA colonization surveillance program. It is not intended to diagnose MRSA infection nor to guide or monitor treatment for MRSA infections. Test performance is not FDA approved in patients less than 76 years old. Performed at [facility], [address] [place], [place] [address]   Culture, blood (single) w Reflex to ID Panel     Status: None   Collection Time: 05/07/24 11:49 AM   Specimen: BLOOD   Result Value Ref Range Status   Specimen Description BLOOD BLOOD LEFT HAND  Final   Special Requests   Final    BOTTLES DRAWN AEROBIC AND ANAEROBIC Blood Culture adequate volume   Culture   Final    NO GROWTH 5 DAYS Performed at [facility], [address] [place], [place] [address]    Report Status 05/12/2024 FINAL  Final  Resp panel by RT-PCR (RSV, Flu A&B, Covid) Anterior Nasal Swab     Status: None   Collection Time: 05/07/24  3:12 PM   Specimen: Anterior Nasal Swab  Result Value Ref Range Status   SARS Coronavirus 2 by RT PCR NEGATIVE NEGATIVE Final    Comment: (NOTE) SARS-CoV-2 target nucleic acids are NOT DETECTED.  The SARS-CoV-2 RNA is generally detectable in upper respiratory specimens during the acute phase of infection. The lowest concentration of SARS-CoV-2 viral copies this assay can detect is 138 copies/mL. A negative result does not preclude SARS-Cov-2 infection and should not be used as the sole basis for treatment or other patient management decisions. A negative result may occur with  improper specimen collection/handling, submission of specimen other than nasopharyngeal swab, presence of viral mutation(s) within the areas targeted by this assay, and inadequate number of viral copies(<138 copies/mL). A negative result must be combined with clinical observations, patient history, and epidemiological information. The expected result is Negative.  Fact Sheet for Patients:  bloggercourse.com  Fact Sheet for Healthcare Providers:  seriousbroker.it  This test is no t yet approved or cleared by the [place]  FDA and  has been authorized for detection and/or diagnosis of SARS-CoV-2 by FDA under an Emergency Use Authorization (EUA). This EUA will remain  in effect (meaning this test can be used) for the duration of the COVID-19 declaration under Section 564(b)(1) of the Act, 21 U.S.C.section  360bbb-3(b)(1), unless the authorization is terminated  or revoked sooner.       Influenza A by PCR NEGATIVE NEGATIVE Final   Influenza B by PCR NEGATIVE NEGATIVE Final    Comment: (NOTE) The Xpert Xpress SARS-CoV-2/FLU/RSV plus assay is intended as an aid in the diagnosis of influenza from Nasopharyngeal swab specimens and should not be used as a sole basis for treatment. Nasal washings and aspirates are unacceptable for Xpert Xpress SARS-CoV-2/FLU/RSV testing.  Fact Sheet for Patients: bloggercourse.com  Fact Sheet for Healthcare Providers: seriousbroker.it  This test is not yet approved or cleared by the [place]  FDA and has been authorized for detection and/or  diagnosis of SARS-CoV-2 by FDA under an Emergency Use Authorization (EUA). This EUA will remain in effect (meaning this test can be used) for the duration of the COVID-19 declaration under Section 564(b)(1) of the Act, 21 U.S.C. section 360bbb-3(b)(1), unless the authorization is terminated or revoked.     Resp Syncytial Virus by PCR NEGATIVE NEGATIVE Final    Comment: (NOTE) Fact Sheet for Patients: bloggercourse.com  Fact Sheet for Healthcare Providers: seriousbroker.it  This test is not yet approved or cleared by the [place]  FDA and has been authorized for detection and/or diagnosis of SARS-CoV-2 by FDA under an Emergency Use Authorization (EUA). This EUA will remain in effect (meaning this test can be used) for the duration of the COVID-19 declaration under Section 564(b)(1) of the Act, 21 U.S.C. section 360bbb-3(b)(1), unless the authorization is terminated or revoked.  Performed at [facility], [address] [place], [place] [address]     Coagulation Studies: No results for input(s): LABPROT, INR in the last 72 hours.   Urinalysis: No results for input(s): COLORURINE,  LABSPEC, PHURINE, GLUCOSEU, HGBUR, BILIRUBINUR, KETONESUR, PROTEINUR, UROBILINOGEN, NITRITE, LEUKOCYTESUR in the last 72 hours.  Invalid input(s): APPERANCEUR    Imaging: No results found.    Medications:      atorvastatin   20 mg Oral Daily   Chlorhexidine  Gluconate Cloth  6 each Topical Q0600   cinacalcet   60 mg Oral Q breakfast   epoetin  alfa-epbx (RETACRIT ) injection  10,000 Units Intravenous Q T,Th,Sat-1800   feeding supplement (NEPRO CARB STEADY)  237 mL Oral BID BM   fludrocortisone   0.1 mg Oral Daily   fluticasone  furoate-vilanterol  1 puff Inhalation Daily   [START ON 05/15/2024] gabapentin   300 mg Oral QHS   heparin   5,000 Units Subcutaneous Q8H   midodrine   20 mg Oral TID WC   multivitamin  1 tablet Oral QHS   psyllium  1 packet Oral Daily   acetaminophen  **OR** acetaminophen , dextrose , HYDROcodone -acetaminophen , loperamide , ondansetron  **OR** ondansetron  (ZOFRAN ) IV, mouth rinse  Assessment/ Plan:  Cynthia Harvey is a 73 y.o.  female  with past medical history of SLE, pAfib, chronic hypotension and end stage renal disease on hemodialysis, who was admitted to [facility] on 05/06/2024 for Hypoglycemia [E16.2] Hypotension [I95.9] Sepsis (HCC) [A41.9] Hypotension, unspecified hypotension type [I95.9]   End-stage renal disease on hemodialysis. Dialysis received yesterday, UF achieved. Next treatment scheduled for Thursday    2. Anemia of chronic kidney disease Lab Results  Component Value Date   HGB 8.0 (L) 05/13/2024  Patient receives Mircera at outpatient clinic.  Continue Retacrit  10000 units IV with dialysis.   3. Secondary Hyperparathyroidism: with outpatient labs: PTH 366, phosphorus 4.5, calcium  8.6 on 11//25    Lab Results  Component Value Date   CALCIUM  8.9 05/13/2024   PHOS 3.8 05/09/2024    Patient prescribed calcitriol, cholecalciferol, Sensipar , and sevelamer outpatient. Continue Sensipar .   4. Hypotension likely  secondary to sepsis. Suspected bacterial peritonitis . Recent PD catheter removal. Elevated white count and lactic acid. Remains on meropenem .  Prescribed midodrine    LOS: 7 Merryn Thaker 11/19/20252:29 PM

## 2024-05-14 NOTE — Progress Notes (Signed)
 Progress Note   Patient: Cynthia Harvey FMW:[id number] DOB: 10/13/1950 DOA: 05/06/2024     7 DOS: the patient was seen and examined on 05/14/2024   Brief hospital course:  Hospital course / significant events:    HPI:  Wannetta Langland is a 73 y.o. female with medical history significant for SLE on hydroxychloroquine , complicated by ESRD recently transitioned from PD to HD during a hospitalization at [facility] (10/11 to 04/21/2024) for PD associated Pseudomonas peritonitis(meropenem -> Cipro, completed 04/28/2024), brief paroxysmal A-fib during that hospitalization (not placed on [facility]), chronic hypotension on midodrine  and Florinef  presenting w/ chief complaint hypotension, sent by dialysis center.    during her recent hospitalization she had recurring problems with hypotension/hypoglycemia s/p cortisol stimulation test with normal cortisol values.    11/11: admitted to hospitalist service with concern for sepsis, suspect from SBP  11/12: BP continuing to drop, transfer to ICU service for pressors. IR attempted paracentesis but no ascites visible for sampling. Crotisol level was 13.3, and this might represent mild functional adrenal insufficiency, so hydrocortisone  was started.  11/13: holding off dialysis d/t low BP, remains on levo --> off later in the day. Pt c/o nausea CT Abd/Pelvis non concerning   11/14: midodrine  20 mg 3 times daily, dialysis treatment today with no UF, Albumin  25 g IV given.  No longer on vasopressors.  MAP goal reduced to 60.  SBP goal reduced to 80.  Nausea improved. 11/15: overnight hypotensive again repeat lactic acid showing some poor perfusion with an increased value of 2.9. and back to ICU on levophed . Art line inserted for close BP monitoring. It is unclear what the source of the patient's continued hypotension is, cultures remaining negative and broad spectrum antibiotics day 4 today. Off pressors again in afternoon - Arterial line revealed cuff pressures are NOT  accurate pt NOT hypotensive so vasopressors stopped.  Echo pending 11/16: remain off pressors. Dialysis today. Plan transfer to hospitalist tomorrow. Echo LVEF 55-60 no RWMA, normal diastolic, no significant valve abn 11/17: hospitalist assumes care. Art line out. Weaning down on IV steroids 11/18: stopping IV steroids this evening (last dose this AM), if BP holding anticipate medically stable tomorrow, hopefully can go back to [facility]. Will finish 7 days abx tomorrow              Consultants:  [facility] Nephrology  Interventional Radiology    Procedures/Surgeries: 11/15: art line R radial 11/15: CVC line R femoral            ASSESSMENT & PLAN:   #Circulatory shock~resolved   #Possible adrenal insufficiency  Hx: Chronic hypotension and atrial fibrillation  telemetry monitoring  Midodrine  Art line in place for close BP monitoring / accurate measurement  stress dose steroids iv 100 mg q8hrs --> weaned off to q12h --> dc today  Continue po Florinef    #ESRD on HD  #Hyponatremia  #Hypomagnesia  Trend BMP  Replace electrolytes as indicated  Nephrology consulted appreciate input: HD per recommendations    #Possible peritonitis  #Nausea/vomiting~resolved   Trend WBC and monitor fever curve Repeat hepatic function panel   KUB negative  Patient to complete the course of meropenem  today IR unable to perform paracentesis no ascites visible on US  to allow for safe approach COVID/Influenza A&B/RSV: negative    #Systemic Lupus Erythematosus  hold outpatient hydroxychloroquine  for now given concern for sepsis    #Anemia without signs of active bleeding  trend CBC  Monitor for s/sx of bleeding  Transfuse for hgb <7    #  Hypoglycemia  Continue diet  CBG's q4hrs --> as needed  Follow hyper/hypoglycemic protocol        Class 1 obesity based on BMI: Body mass index is 33.49 kg/m.[name] Significantly low or high BMI is associated with higher medical risk.  Healthy nutrition and  physical activity advised as adjunct to other disease management and risk reduction treatments       DVT prophylaxis: heparin   Code Status: FULL DOE  TOC needs: TBD Medical barriers to dispo: weaning IV steroids. Expected medical readiness for discharge hopefully by tomorrow   Subjective  Patient seen and examined at bedside this morning Admits to improvement in abdominal pain Patient is scheduled to complete 7 days course of IV antibiotics today Sodium level slightly low Hopefully if everything normal patient will be discharged to facility by tomorrow   Family Communication: none at bedside       Examination:  Physical Exam Constitutional:      General: She is not in acute distress. Cardiovascular:     Rate and Rhythm: Normal rate and regular rhythm.  Pulmonary:     Effort: Pulmonary effort is normal.     Breath sounds: Normal breath sounds.  Abdominal:     Palpations: Abdomen is soft.  Musculoskeletal:     Right lower leg: No edema.     Left lower leg: No edema.  Skin:    General: Skin is warm and dry.  Neurological:     Mental Status: She is alert and oriented to person, place, and time.  Psychiatric:        Mood and Affect: Mood normal.        Behavior: Behavior normal.    Vitals:   05/14/24 0500 05/14/24 0738 05/14/24 1110 05/14/24 1515  BP:  117/67 (!) 101/51 (!) 94/50  Pulse:  78 72 85  Resp:  16 18 18   Temp:  98 F (36.7 C) 98 F (36.7 C) 98 F (36.7 C)  TempSrc:  Oral Oral Oral  SpO2:  100% 100% 100%  Weight: 70.6 kg     Height:        Data Reviewed:     Latest Ref Rng & Units 05/13/2024    5:00 AM 05/12/2024    4:05 AM 05/11/2024    4:04 AM  CBC  WBC 4.0 - 10.5 K/uL 12.0  11.7  6.9   Hemoglobin 12.0 - 15.0 g/dL 8.0  8.3  8.5   Hematocrit 36.0 - 46.0 % 24.7  25.6  26.6   Platelets 150 - 400 K/uL 256  242  270        Latest Ref Rng & Units 05/13/2024    5:00 AM 05/12/2024    4:05 AM 05/11/2024    4:04 AM  BMP  Glucose 70 - 99  mg/dL 892  99  895   BUN 8 - 23 mg/dL 26  17  10    Creatinine 0.44 - 1.00 mg/dL 3.33  4.62  5.38   Sodium 135 - 145 mmol/L 129  130  129   Potassium 3.5 - 5.1 mmol/L 5.0  5.2  5.0   Chloride 98 - 111 mmol/L 96  99  98   CO2 22 - 32 mmol/L 23  21  21    Calcium  8.9 - 10.3 mg/dL 8.9  8.6  8.2     Author: Drue ONEIDA Potter, MD 05/14/2024 6:01 PM  For on call review [email or web address].

## 2024-05-14 NOTE — Plan of Care (Signed)
?  Problem: Clinical Measurements: ?Goal: Diagnostic test results will improve ?Outcome: Progressing ?  ?Problem: Health Behavior/Discharge Planning: ?Goal: Ability to manage health-related needs will improve ?Outcome: Progressing ?  ?Problem: Clinical Measurements: ?Goal: Ability to maintain clinical measurements within normal limits will improve ?Outcome: Progressing ?  ?

## 2024-05-14 NOTE — TOC Progression Note (Addendum)
 Transition of Care [facility]) - Progression Note    Patient Details  Name: Cynthia Harvey MRN: [id number] Date of Birth: 25-Nov-1950  [facility] CM/SW Contact  Shasta DELENA Daring, RN Phone Number: 05/14/2024, 10:30 AM  Clinical Narrative:    Patient has shara for [facility] resources. Auth valid until 11/23. Will follow for discharge readiness.  2:12 PM Received confirmation from [facility] that they will have a bed for patient on Thursday 11/20.    Barriers to Discharge: Continued Medical Work up               Expected Discharge Plan and Services       Living arrangements for the past 2 months: Skilled Nursing Facility                                       Social Drivers of Health (SDOH) Interventions SDOH Screenings   Food Insecurity: No Food Insecurity (05/07/2024)  Housing: Unknown (05/07/2024)  Transportation Needs: No Transportation Needs (05/07/2024)  Utilities: Not At Risk (05/07/2024)  Financial Resource Strain: Low Risk (03/14/2024)   Received from [facility]  Physical Activity: Unknown (06/01/2023)   Received from [facility]  Social Connections: Unknown (05/07/2024)  Stress: No Stress Concern Present (06/01/2023)   Received from [facility]  Tobacco Use: Low Risk  (05/06/2024)  Health Literacy: Low Risk (10/01/2020)   Received from [facility]    Readmission Risk Interventions     No data to display

## 2024-05-14 NOTE — Progress Notes (Signed)
 Occupational Therapy Treatment Patient Details Name: Cynthia Harvey MRN: [id number] DOB: 03-04-1951 Today's Date: 05/14/2024   History of present illness Patient is a 73 year old female admitted to ICU for management of spetic shock, requiring vasopressor support for BP management. PMH: ESRD on HD, SLE, pseudomonal peritonitis   OT comments  Patient seen for OT treatment on this date. Upon arrival to room patient resting in recliner, reports fatigue from ADL prior to OT arrival with nursing staff, but is  agreeable to treatment. Focus of tx was on UB mobility/scapular mobility and core stability, see below for therex performed. Patient tolerated well, agreeable to perform on her own to improve overall mobility in preparation for ADLs/ambulation.  Patient ended treatment in recliner with all needs within reach. Patient making good progress toward goals, will continue to follow POC. Discharge recommendation remains appropriate.        If plan is discharge home, recommend the following:  A little help with walking and/or transfers;A little help with bathing/dressing/bathroom;Assistance with cooking/housework;Assist for transportation;Help with stairs or ramp for entrance   Equipment Recommendations  Other (comment) (defer to next venue of care)    Recommendations for Other Services      Precautions / Restrictions Precautions Precautions: Fall Recall of Precautions/Restrictions: Intact Restrictions Weight Bearing Restrictions Per Provider Order: No       Mobility Bed Mobility               General bed mobility comments: not tested, patient in chair pre/post tx    Transfers                   General transfer comment: patient declined transfers, just came from bathroom for ADL and was fatigued     Balance   Sitting-balance support: Feet supported Sitting balance-Leahy Scale: Good                                     ADL either performed or  assessed with clinical judgement   ADL Overall ADL's : Needs assistance/impaired     Grooming: Wash/dry face;Sitting;Set up (applying)                                 General ADL Comments: able to apply lotion to BLE while reaching forward    Extremity/Trunk Assessment Upper Extremity Assessment Upper Extremity Assessment: Generalized weakness            Vision       Perception Perception Perception: Within Functional Limits   Praxis     Communication Communication Communication: No apparent difficulties   Cognition Arousal: Alert Behavior During Therapy: WFL for tasks assessed/performed, Flat affect Cognition: No apparent impairments                               Following commands: Intact        Cueing   Cueing Techniques: Verbal cues  Exercises Exercises: Other exercises Other Exercises Other Exercises: core stability/flexibility/rotation HEP: neck flexion/extension, neck rotation, shoulder cirlces, AAROM shoulder flexion, scapular protraction/retraction with trunk flexion for B hamstring stretch, trunk rotation; 10 reps of each    Shoulder Instructions       General Comments      Pertinent Vitals/ Pain       Pain Assessment Pain  Assessment: No/denies pain  Home Living                                          Prior Functioning/Environment              Frequency  Min 2X/week        Progress Toward Goals  OT Goals(current goals can now be found in the care plan section)  Progress towards OT goals: Progressing toward goals  Acute Rehab OT Goals Patient Stated Goal: to get stronger OT Goal Formulation: With patient Time For Goal Achievement: 05/22/24 Potential to Achieve Goals: Fair ADL Goals Pt Will Perform Grooming: with supervision;standing Pt Will Perform Lower Body Dressing: with supervision;sit to/from stand Pt Will Transfer to Toilet: with supervision;ambulating Pt Will Perform  Toileting - Clothing Manipulation and hygiene: with supervision;sit to/from stand  Plan      Co-evaluation                 AM-PAC OT 6 Clicks Daily Activity     Outcome Measure   Help from another person eating meals?: None Help from another person taking care of personal grooming?: None Help from another person toileting, which includes using toliet, bedpan, or urinal?: A Little Help from another person bathing (including washing, rinsing, drying)?: A Little Help from another person to put on and taking off regular upper body clothing?: None Help from another person to put on and taking off regular lower body clothing?: A Little 6 Click Score: 21    End of Session    OT Visit Diagnosis: Unsteadiness on feet (R26.81);Muscle weakness (generalized) (M62.81)   Activity Tolerance Patient tolerated treatment well   Patient Left in chair;with call bell/phone within reach   Nurse Communication          Time: 8953-8895 OT Time Calculation (min): 18 min  Charges: OT General Charges $OT Visit: 1 Visit OT Treatments $Therapeutic Exercise: 8-22 mins  Rogers Clause, OT/L MSOT, 05/14/2024

## 2024-05-14 NOTE — Progress Notes (Signed)
 PT Cancellation Note  Patient Details Name: Cynthia Harvey MRN: [id number] DOB: 1951/02/10   Cancelled Treatment:     Therapist in to see pt for continued PT. Pt stated that she was already up several times in the room independently and was too fatigued this pm. Educated pt on benefits of frequent mobility. Will re-attempt next available date. HD tomorrow.     [name] 05/14/2024, 3:20 PM

## 2024-05-15 DIAGNOSIS — A419 Sepsis, unspecified organism: Secondary | ICD-10-CM | POA: Diagnosis not present

## 2024-05-15 DIAGNOSIS — R6521 Severe sepsis with septic shock: Secondary | ICD-10-CM | POA: Diagnosis not present

## 2024-05-15 LAB — CBC WITH DIFFERENTIAL/PLATELET
Abs Immature Granulocytes: 0.16 K/uL — ABNORMAL HIGH (ref 0.00–0.07)
Basophils Absolute: 0 K/uL (ref 0.0–0.1)
Basophils Relative: 0 %
Eosinophils Absolute: 0.4 K/uL (ref 0.0–0.5)
Eosinophils Relative: 4 %
HCT: 23.9 % — ABNORMAL LOW (ref 36.0–46.0)
Hemoglobin: 7.5 g/dL — ABNORMAL LOW (ref 12.0–15.0)
Immature Granulocytes: 2 %
Lymphocytes Relative: 42 %
Lymphs Abs: 4.5 K/uL — ABNORMAL HIGH (ref 0.7–4.0)
MCH: 31.3 pg (ref 26.0–34.0)
MCHC: 31.4 g/dL (ref 30.0–36.0)
MCV: 99.6 fL (ref 80.0–100.0)
Monocytes Absolute: 0.9 K/uL (ref 0.1–1.0)
Monocytes Relative: 9 %
Neutro Abs: 4.6 K/uL (ref 1.7–7.7)
Neutrophils Relative %: 43 %
Platelets: 258 K/uL (ref 150–400)
RBC: 2.4 MIL/uL — ABNORMAL LOW (ref 3.87–5.11)
RDW: 17.2 % — ABNORMAL HIGH (ref 11.5–15.5)
WBC: 10.6 K/uL — ABNORMAL HIGH (ref 4.0–10.5)
nRBC: 1.4 % — ABNORMAL HIGH (ref 0.0–0.2)

## 2024-05-15 LAB — BASIC METABOLIC PANEL WITH GFR
Anion gap: 8 (ref 5–15)
BUN: 23 mg/dL (ref 8–23)
CO2: 26 mmol/L (ref 22–32)
Calcium: 7.6 mg/dL — ABNORMAL LOW (ref 8.9–10.3)
Chloride: 106 mmol/L (ref 98–111)
Creatinine, Ser: 5.45 mg/dL — ABNORMAL HIGH (ref 0.44–1.00)
GFR, Estimated: 8 mL/min — ABNORMAL LOW (ref >60)
Glucose, Bld: 74 mg/dL (ref 70–99)
Potassium: 3.6 mmol/L (ref 3.5–5.1)
Sodium: 140 mmol/L (ref 135–145)

## 2024-05-15 LAB — PHOSPHORUS: Phosphorus: 2.5 mg/dL (ref 2.5–4.6)

## 2024-05-15 LAB — ALBUMIN: Albumin: 2.9 g/dL — ABNORMAL LOW (ref 3.5–5.0)

## 2024-05-15 MED ORDER — HEPARIN SODIUM (PORCINE) 1000 UNIT/ML IJ SOLN
INTRAMUSCULAR | Status: AC
Start: 2024-05-15 — End: 2024-05-15
  Filled 2024-05-15: qty 4

## 2024-05-15 MED ORDER — RENA-VITE PO TABS: 1.0000 | ORAL_TABLET | Freq: Every day | ORAL | Status: AC

## 2024-05-15 MED ORDER — HYDROCODONE-ACETAMINOPHEN 5-325 MG PO TABS: 1.0000 | ORAL_TABLET | ORAL | 0 refills | Status: AC | PRN

## 2024-05-15 MED ORDER — MIDODRINE HCL 5 MG PO TABS
ORAL_TABLET | ORAL | Status: AC
Start: 2024-05-15 — End: 2024-05-15
  Filled 2024-05-15: qty 2

## 2024-05-15 MED ORDER — EPOETIN ALFA-EPBX 10000 UNIT/ML IJ SOLN
INTRAMUSCULAR | Status: AC
  Filled 2024-05-15: qty 1

## 2024-05-15 MED ORDER — MIDODRINE HCL 5 MG PO TABS
ORAL_TABLET | ORAL | Status: AC
  Filled 2024-05-15: qty 2

## 2024-05-15 MED ORDER — HYDROCODONE-ACETAMINOPHEN 5-325 MG PO TABS: 1.0000 | ORAL_TABLET | ORAL | 0 refills | Status: DC | PRN

## 2024-05-15 NOTE — Discharge Summary (Signed)
 Physician Discharge Summary   Patient: Cynthia Harvey MRN: [id number] DOB: 06/25/1951  Admit date:     05/06/2024  Discharge date: 05/15/24  Discharge Physician: Drue ONEIDA Potter   PCP: Henry Fitch, MD   Recommendations at discharge:    Discharge Diagnoses: Principal Problem:   Septic shock [facility]) Active Problems:   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(10/11-11/3/25)   Hypotension, acute on chronic   Hypoglycemia   ESRD on hemodialysis (HCC)   History of anemia due to chronic kidney disease   Systemic lupus erythematosus (HCC)  Resolved Problems:   * No resolved hospital problems. *  Hospital Course:   HPI:  Cynthia Harvey is a 73 y.o. female with medical history significant for SLE on hydroxychloroquine, complicated by ESRD recently transitioned from PD to HD during a hospitalization at [facility] (10/11 to 04/21/2024) for PD associated Pseudomonas peritonitis(meropenem-> Cipro, completed 04/28/2024), brief paroxysmal A-fib during that hospitalization (not placed on [facility]), chronic hypotension on midodrine and Florinef presenting w/ chief complaint hypotension, sent by dialysis center.    during her recent hospitalization she had recurring problems with hypotension/hypoglycemia s/p cortisol stimulation test with normal cortisol values.    11/11: admitted to hospitalist service with concern for sepsis, suspect from SBP  11/12: BP continuing to drop, transfer to ICU service for pressors. IR attempted paracentesis but no ascites visible for sampling. Crotisol level was 13.3, and this might represent mild functional adrenal insufficiency, so hydrocortisone was started.  11/13: holding off dialysis d/t low BP, remains on levo --> off later in the day. Pt c/o nausea CT Abd/Pelvis non concerning   11/14: midodrine 20 mg 3 times daily, dialysis treatment today with no UF, Albumin 25 g IV given.  No longer on vasopressors.  MAP goal reduced to  60.  SBP goal reduced to 80.  Nausea improved. 11/15: overnight hypotensive again repeat lactic acid showing some poor perfusion with an increased value of 2.9. and back to ICU on levophed. Art line inserted for close BP monitoring. It is unclear what the source of the patient's continued hypotension is, cultures remaining negative and broad spectrum antibiotics day 4 today. Off pressors again in afternoon - Arterial line revealed cuff pressures are NOT accurate pt NOT hypotensive so vasopressors stopped.  Echo pending 11/16: remain off pressors. Dialysis today. Plan transfer to hospitalist tomorrow. Echo LVEF 55-60 no RWMA, normal diastolic, no significant valve abn 11/17: hospitalist assumes care. Art line out. Weaning down on IV steroids 11/18: stopping IV steroids this evening (last dose this AM), if BP holding anticipate medically stable tomorrow, hopefully can go back to [facility]. Will finish 7 days abx tomorrow  11/19: Patient completed IV antibiotics and blood pressure was closely monitored. 11/20: Patient underwent hemodialysis today, blood pressure maintained good and therefore patient cleared for discharge to facility today.   Consultants:  [facility] Nephrology  Interventional Radiology    Procedures/Surgeries: 11/15: art line R radial 11/15: CVC line R femoral            ASSESSMENT & PLAN:   #Circulatory shock~resolved   #Possible adrenal insufficiency  Hx: Chronic hypotension and atrial fibrillation  Patient monitored on telemetry Continue midodrine Weaned off steroid therapy Continue po Florinef   #ESRD on HD  #Hyponatremia  #Hypomagnesia  Nephrologist on board for dialysis need   #Possible peritonitis  #Nausea/vomiting~resolved   Trend WBC and monitor fever curve Repeat hepatic function panel   KUB negative  Completed meropenem course COVID/Influenza A&B/RSV: negative    #  Systemic Lupus Erythematosus  Resume hydroxychloroquine on discharge   #Anemia without signs  of active bleeding  trend CBC  Monitor for s/sx of bleeding  Transfuse for hgb <7    #Hypoglycemia  Continue diet     Class 1 obesity based on BMI: Body mass index is 33.49 kg/m.[name] Significantly low or high BMI is associated with higher medical risk.  Healthy nutrition and physical activity advised as adjunct to other disease management and risk reduction treatments     Disposition: Skilled nursing facility Diet recommendation:  Renal diet DISCHARGE MEDICATION: Allergies as of 05/15/2024       Reactions   Hydrochlorothiazide Hives   Lisinopril Hives   Angioedema   Spironolactone Other (See Comments)   Other Reaction(s): Breast Pain Breast pain   Empagliflozin Dermatitis        Medication List     TAKE these medications    acetaminophen 325 MG tablet Commonly known as: TYLENOL Take 650 mg by mouth every 4 (four) hours as needed for mild pain (pain score 1-3).   atorvastatin 20 MG tablet Commonly known as: LIPITOR Take 20 mg by mouth daily.   cinacalcet 60 MG tablet Commonly known as: SENSIPAR Take 60 mg by mouth daily.   fludrocortisone 0.1 MG tablet Commonly known as: FLORINEF Take 0.1 mg by mouth daily.   fluticasone furoate-vilanterol 100-25 MCG/ACT Aepb Commonly known as: BREO ELLIPTA Inhale 1 puff into the lungs daily.   gabapentin 300 MG capsule Commonly known as: NEURONTIN Take 300 mg by mouth 2 (two) times daily.   HYDROcodone-acetaminophen 5-325 MG tablet Commonly known as: NORCO/VICODIN Take 1-2 tablets by mouth every 4 (four) hours as needed for moderate pain (pain score 4-6).   hydroxychloroquine 200 MG tablet Commonly known as: PLAQUENIL Take 200 mg by mouth daily.   lidocaine 5 % Commonly known as: LIDODERM Place 1 patch onto the skin daily. Remove & Discard patch within 12 hours or as directed by MD   midodrine 10 MG tablet Commonly known as: PROAMATINE Take 20 mg by mouth 3 (three) times daily.   multivitamin Tabs tablet Take  1 tablet by mouth at bedtime.   ondansetron 4 MG disintegrating tablet Commonly known as: ZOFRAN-ODT Take 4 mg by mouth every 8 (eight) hours as needed for nausea or vomiting.   Retacrit 10000 UNIT/ML injection Generic drug: epoetin alfa-epbx Inject 10,000 Units into the skin 3 (three) times a week. Monday, Wednesday, Friday        Discharge Exam: Filed Weights   05/14/24 0500 05/15/24 0500 05/15/24 0734  Weight: 70.6 kg 73.6 kg 71.3 kg   Constitutional:      General: She is not in acute distress. Cardiovascular:     Rate and Rhythm: Normal rate and regular rhythm.  Pulmonary:     Effort: Pulmonary effort is normal.     Breath sounds: Normal breath sounds.  Abdominal:     Palpations: Abdomen is soft.  Musculoskeletal:     Right lower leg: No edema.     Left lower leg: No edema.  Skin:    General: Skin is warm and dry.  Dialysis catheter in place Neurological:     Mental Status: She is alert and oriented to person, place, and time.  Psychiatric:        Mood and Affect: Mood normal.        Behavior: Behavior normal.   Condition at discharge: good  The results of significant diagnostics from this hospitalization (including imaging, microbiology,  ancillary and laboratory) are listed below for reference.   Imaging Studies: ECHOCARDIOGRAM COMPLETE Result Date: 05/11/2024    ECHOCARDIOGRAM REPORT   Patient Name:   STEPAHNIE CAMPO [facility] Date of Exam: 05/11/2024 Medical Rec #:  [id number]          Height:       57.0 in Accession #:    [id number]         Weight:       160.1 lb Date of Birth:  04-Mar-1951          BSA:          1.636 m Patient Age:    73 years           BP:           148/114 mmHg Patient Gender: F                  HR:           70 bpm. Exam Location:  [facility] Procedure: 2D Echo, Cardiac Doppler and Color Doppler (Both Spectral and Color            Flow Doppler were utilized during procedure). Indications:     Shock R57.9  History:         Patient has no prior history of  Echocardiogram examinations.  Sonographer:     Bernice Rubinstein RDCS Referring Phys:  [id number] KHABIB DGAYLI Diagnosing Phys: Annabella Scarce MD  Sonographer Comments: Image acquisition challenging due to respiratory motion. IMPRESSIONS  1. Left ventricular ejection fraction, by estimation, is 55 to 60%. The left ventricle has normal function. The left ventricle has no regional wall motion abnormalities. Left ventricular diastolic parameters were normal.  2. Right ventricular systolic function is normal. The right ventricular size is normal. There is normal pulmonary artery systolic pressure.  3. Left atrial size was mildly dilated.  4. The mitral valve is normal in structure. Mild mitral valve regurgitation. No evidence of mitral stenosis.  5. The aortic valve is tricuspid. Aortic valve regurgitation is mild to moderate. No aortic stenosis is present.  6. The inferior vena cava is normal in size with greater than 50% respiratory variability, suggesting right atrial pressure of 3 mmHg. FINDINGS  Left Ventricle: Left ventricular ejection fraction, by estimation, is 55 to 60%. The left ventricle has normal function. The left ventricle has no regional wall motion abnormalities. The left ventricular internal cavity size was normal in size. There is  no left ventricular hypertrophy. Left ventricular diastolic parameters were normal. Normal left ventricular filling pressure. Right Ventricle: The right ventricular size is normal. No increase in right ventricular wall thickness. Right ventricular systolic function is normal. There is normal pulmonary artery systolic pressure. The tricuspid regurgitant velocity is 2.21 m/s, and  with an assumed right atrial pressure of 3 mmHg, the estimated right ventricular systolic pressure is 22.5 mmHg. Left Atrium: Left atrial size was mildly dilated. Right Atrium: Right atrial size was normal in size. Pericardium: There is no evidence of pericardial effusion. Mitral Valve: The mitral valve  is normal in structure. Mild mitral valve regurgitation. No evidence of mitral valve stenosis. Tricuspid Valve: The tricuspid valve is normal in structure. Tricuspid valve regurgitation is mild . No evidence of tricuspid stenosis. Aortic Valve: The aortic valve is tricuspid. Aortic valve regurgitation is mild to moderate. No aortic stenosis is present. Aortic valve peak gradient measures 8.0 mmHg. Pulmonic Valve: The pulmonic valve was normal in structure. Pulmonic valve regurgitation is trivial. No  evidence of pulmonic stenosis. Aorta: The aortic root is normal in size and structure. Venous: The inferior vena cava is normal in size with greater than 50% respiratory variability, suggesting right atrial pressure of 3 mmHg. IAS/Shunts: No atrial level shunt detected by color flow Doppler.  LEFT VENTRICLE PLAX 2D LVIDd:         3.00 cm   Diastology LVIDs:         2.10 cm   LV e' medial:    8.33 cm/s LV PW:         0.90 cm   LV E/e' medial:  8.7 LV IVS:        1.00 cm   LV e' lateral:   10.90 cm/s LVOT diam:     2.00 cm   LV E/e' lateral: 6.6 LV SV:         76 LV SV Index:   47 LVOT Area:     3.14 cm  RIGHT VENTRICLE RV Basal diam:  3.50 cm RV S prime:     10.80 cm/s TAPSE (M-mode): 1.8 cm LEFT ATRIUM             Index        RIGHT ATRIUM           Index LA diam:        3.20 cm 1.96 cm/m   RA Area:     12.00 cm LA Vol (A2C):   33.9 ml 20.72 ml/m  RA Volume:   26.80 ml  16.38 ml/m LA Vol (A4C):   49.9 ml 30.50 ml/m LA Biplane Vol: 42.9 ml 26.22 ml/m  AORTIC VALVE                 PULMONIC VALVE AV Area (Vmax): 2.63 cm     PV Vmax:          0.72 m/s AV Vmax:        141.00 cm/s  PV Peak grad:     2.1 mmHg AV Peak Grad:   8.0 mmHg     PR End Diast Vel: 0.99 msec LVOT Vmax:      118.00 cm/s  RVOT Peak grad:   0 mmHg LVOT Vmean:     73.100 cm/s LVOT VTI:       0.243 m  AORTA Ao Root diam: 3.10 cm Ao Asc diam:  3.10 cm MITRAL VALVE               TRICUSPID VALVE MV Area (PHT): 3.30 cm    TR Peak grad:   19.5 mmHg MV  Decel Time: 230 msec    TR Vmax:        221.00 cm/s MV E velocity: 72.20 cm/s MV A velocity: 95.20 cm/s  SHUNTS MV E/A ratio:  0.76        Systemic VTI:  0.24 m                            Systemic Diam: 2.00 cm Annabella Scarce MD Electronically signed by Annabella Scarce MD Signature Date/Time: 05/11/2024/12:07:39 PM    Final    CT ABDOMEN PELVIS WO CONTRAST Result Date: 05/08/2024 EXAM: CT ABDOMEN AND PELVIS WITHOUT CONTRAST 05/08/2024 09:57:55 AM TECHNIQUE: CT of the abdomen and pelvis was performed without the administration of intravenous contrast. Multiplanar reformatted images are provided for review. Automated exposure control, iterative reconstruction, and/or weight-based adjustment of the mA/kV was utilized to reduce the radiation dose to  as low as reasonably achievable. COMPARISON: 07/24/2012 CLINICAL HISTORY: Sepsis FINDINGS: LOWER CHEST: Hemodialysis catheter in place, tip at the cavoatrial junction. Posterior bibasilar dependent atelectasis. LIVER: The liver is unremarkable. GALLBLADDER AND BILE DUCTS: Decompressed gallbladder. No radiopaque gallstones. No biliary ductal dilatation. SPLEEN: No acute abnormality. PANCREAS: No acute abnormality. ADRENAL GLANDS: No acute abnormality. KIDNEYS, URETERS AND BLADDER: Multiple bilateral renal cysts. The largest measures 2.5 cm in the right interpolar region, which is likely a proteinaceous cyst. No stones in the kidneys or ureters. No hydronephrosis. No perinephric or periureteral stranding. Urinary bladder is unremarkable. GI AND BOWEL: Stomach demonstrates no acute abnormality. The appendix was not visualized. No pericecal or right lower quadrant inflammatory stranding to suggest acute appendicitis. There is no bowel obstruction. PERITONEUM AND RETROPERITONEUM: No ascites. No free air. VASCULATURE: Aorta is normal in caliber. LYMPH NODES: No lymphadenopathy. REPRODUCTIVE ORGANS: Age-related atrophy of the uterus and ovaries. No free pelvic fluid. BONES  AND SOFT TISSUES: Osteopenia. Multilevel degenerative disc disease of the spine. Minimal grade 1 anterolisthesis of L4 on L5. 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 Multiple small soft tissue nodules within the ventral abdominal wall with areas of subcutaneous gas, likely reflecting changes of subcutaneous injection. In the left upper quadrant, there is a focal region of encapsulated, inflamed fat measuring 2.5 x 4.4 cm (axial 35). Additionally, there is a similar region of inflamed fat along the left periumbilical ventral abdominal wall measuring 2.8 x 4.7 cm (axial 56), both likely reflecting fat necrosis. Small ovoid fluid collection in the left lower quadrant abdominal wall measuring 2.4 x 1.7 cm (axial 55), possibly related to subcutaneous injection for an evolving hematoma. IMPRESSION: 1. No acute findings in the abdomen or pelvis. 2. Small ovoid fluid collection in the left lower quadrant abdominal wall measuring 2.4 x 1.7 cm (axial 55), possibly related to subcutaneous injection or an evolving hematoma. Electronically signed by: Rogelia Myers MD 05/08/2024 10:58 AM EST RP Workstation: [id number]   US  Abdomen Limited Result Date: 05/07/2024 CLINICAL DATA:  73 year old female currently admitted with sepsis thought to be related to possible bacterial peritonitis. Request for diagnostic paracentesis. EXAM: ULTRASOUND ABDOMEN LIMITED COMPARISON:  None FINDINGS: Limited ultrasound done of all 4 quadrants. There is no ascites present. There is no pocket of fluid large enough to allow for safe approach for paracentesis. IMPRESSION: No ascites visible on ultrasound to allow for safe approach for paracentesis. Ultrasound performed by: Sherrilee Bal, PA-C Electronically Signed   By: Cordella Banner   On: 05/07/2024 13:17   DG Abd 1 View Result Date: 05/07/2024 CLINICAL DATA:  Nausea vomiting. EXAM: ABDOMEN - 1 VIEW COMPARISON:   None Available. FINDINGS: No gaseous small bowel or colonic dilatation. No unexpected abdominopelvic calcification. Chronic changes noted at the symphysis pubis. IMPRESSION: Nonobstructive bowel gas pattern. Electronically Signed   By: Camellia Candle M.D.   On: 05/07/2024 10:44   DG Chest Port 1 View Result Date: 05/06/2024 CLINICAL DATA:  Hypotension. EXAM: PORTABLE CHEST 1 VIEW COMPARISON:  None Available. FINDINGS: Right IJ central venous catheter has tip over the SVC just above the cavoatrial junction. Lungs are hypoinflated and otherwise clear. Cardiomediastinal silhouette is normal. Mild prominent overlying soft tissues. Bony structures are unremarkable. IMPRESSION: Hypoinflation without acute cardiopulmonary disease. Electronically Signed   By: Toribio Agreste M.D.   On: 05/06/2024 18:37    Microbiology: Results for orders placed or performed during the hospital encounter of 05/06/24  Blood culture (  single)     Status: None   Collection Time: 05/06/24 11:05 PM   Specimen: BLOOD  Result Value Ref Range Status   Specimen Description BLOOD BLOOD RIGHT HAND  Final   Special Requests   Final    BOTTLES DRAWN AEROBIC AND ANAEROBIC Blood Culture adequate volume   Culture   Final    NO GROWTH 5 DAYS Performed at [facility], [address], [place], [place] [address]    Report Status 05/11/2024 FINAL  Final  MRSA Next Gen by PCR, Nasal     Status: None   Collection Time: 05/07/24  9:37 AM   Specimen: Nasal Mucosa; Nasal Swab  Result Value Ref Range Status   MRSA by PCR Next Gen NOT DETECTED NOT DETECTED Final    Comment: (NOTE) The GeneXpert MRSA Assay (FDA approved for NASAL specimens only), is one component of a comprehensive MRSA colonization surveillance program. It is not intended to diagnose MRSA infection nor to guide or monitor treatment for MRSA infections. Test performance is not FDA approved in patients less than 59 years old. Performed at [facility], [address] [place], [place] [address]   Culture, blood (single) w Reflex to ID Panel     Status: None   Collection Time: 05/07/24 11:49 AM   Specimen: BLOOD  Result Value Ref Range Status   Specimen Description BLOOD BLOOD LEFT HAND  Final   Special Requests   Final    BOTTLES DRAWN AEROBIC AND ANAEROBIC Blood Culture adequate volume   Culture   Final    NO GROWTH 5 DAYS Performed at [facility], [address] [place], [place] [address]    Report Status 05/12/2024 FINAL  Final  Resp panel by RT-PCR (RSV, Flu A&B, Covid) Anterior Nasal Swab     Status: None   Collection Time: 05/07/24  3:12 PM   Specimen: Anterior Nasal Swab  Result Value Ref Range Status   SARS Coronavirus 2 by RT PCR NEGATIVE NEGATIVE Final    Comment: (NOTE) SARS-CoV-2 target nucleic acids are NOT DETECTED.  The SARS-CoV-2 RNA is generally detectable in upper respiratory specimens during the acute phase of infection. The lowest concentration of SARS-CoV-2 viral copies this assay can detect is 138 copies/mL. A negative result does not preclude SARS-Cov-2 infection and should not be used as the sole basis for treatment or other patient management decisions. A negative result may occur with  improper specimen collection/handling, submission of specimen other than nasopharyngeal swab, presence of viral mutation(s) within the areas targeted by this assay, and inadequate number of viral copies(<138 copies/mL). A negative result must be combined with clinical observations, patient history, and epidemiological information. The expected result is Negative.  Fact Sheet for Patients:  bloggercourse.com  Fact Sheet for Healthcare Providers:  seriousbroker.it  This test is no t yet approved or cleared by the [place]  FDA and  has been authorized for detection and/or diagnosis of SARS-CoV-2 by FDA under an Emergency Use Authorization (EUA). This EUA  will remain  in effect (meaning this test can be used) for the duration of the COVID-19 declaration under Section 564(b)(1) of the Act, 21 U.S.C.section 360bbb-3(b)(1), unless the authorization is terminated  or revoked sooner.       Influenza A by PCR NEGATIVE NEGATIVE Final   Influenza B by PCR NEGATIVE NEGATIVE Final    Comment: (NOTE) The Xpert Xpress SARS-CoV-2/FLU/RSV plus assay is intended as an aid in the diagnosis of influenza from Nasopharyngeal  swab specimens and should not be used as a sole basis for treatment. Nasal washings and aspirates are unacceptable for Xpert Xpress SARS-CoV-2/FLU/RSV testing.  Fact Sheet for Patients: bloggercourse.com  Fact Sheet for Healthcare Providers: seriousbroker.it  This test is not yet approved or cleared by the [place]  FDA and has been authorized for detection and/or diagnosis of SARS-CoV-2 by FDA under an Emergency Use Authorization (EUA). This EUA will remain in effect (meaning this test can be used) for the duration of the COVID-19 declaration under Section 564(b)(1) of the Act, 21 U.S.C. section 360bbb-3(b)(1), unless the authorization is terminated or revoked.     Resp Syncytial Virus by PCR NEGATIVE NEGATIVE Final    Comment: (NOTE) Fact Sheet for Patients: bloggercourse.com  Fact Sheet for Healthcare Providers: seriousbroker.it  This test is not yet approved or cleared by the [place]  FDA and has been authorized for detection and/or diagnosis of SARS-CoV-2 by FDA under an Emergency Use Authorization (EUA). This EUA will remain in effect (meaning this test can be used) for the duration of the COVID-19 declaration under Section 564(b)(1) of the Act, 21 U.S.C. section 360bbb-3(b)(1), unless the authorization is terminated or revoked.  Performed at [facility], [address] [place], [place]  [address]     Labs: CBC: Recent Labs  Lab 05/09/24 9348 05/11/24 0404 05/12/24 0405 05/13/24 0500 05/15/24 0500  WBC 11.3* 6.9 11.7* 12.0* 10.6*  NEUTROABS 6.9  --   --   --  4.6  HGB 9.3* 8.5* 8.3* 8.0* 7.5*  HCT 29.9* 26.6* 25.6* 24.7* 23.9*  MCV 100.3* 99.3 97.7 97.6 99.6  PLT 291 270 242 256 258   Basic Metabolic Panel: Recent Labs  Lab 05/09/24 0651 05/10/24 0148 05/10/24 0434 05/11/24 0404 05/12/24 0405 05/13/24 0500 05/15/24 0500 05/15/24 0752  NA 137 134* 134* 129* 130* 129* 140  --   K 3.7 3.2* 3.3* 5.0 5.2* 5.0 3.6  --   CL 101 102 99 98 99 96* 106  --   CO2 20* 23 24 21* 21* 23 26  --   GLUCOSE 76 68* 106* 104* 99 107* 74  --   BUN 19 7* 7* 10 17 26* 23  --   CREATININE 6.95* 3.36* 3.73* 4.61* 5.37* 6.66* 5.45*  --   CALCIUM 8.8* 7.5* 7.8* 8.2* 8.6* 8.9 7.6*  --   MG 2.3 1.8  --  1.8 2.7* 2.6*  --   --   PHOS 3.8  --   --   --   --   --   --  2.5   Liver Function Tests: Recent Labs  Lab 05/09/24 0651 05/11/24 0404 05/14/24 1027 05/15/24 0752  AST  --  31  --   --   ALT  --  11  --   --   ALKPHOS  --  65  --   --   BILITOT  --  0.3  --   --   PROT  --  5.4*  --   --   ALBUMIN 2.9* 3.3* 3.2* 2.9*   CBG: Recent Labs  Lab 05/11/24 2326 05/12/24 0437 05/12/24 0734 05/12/24 1132 05/14/24 2036  GLUCAP 95 106* 88 124* 124*    Discharge time spent:  38 minutes.  Signed: Drue ONEIDA Potter, MD [facility] 05/15/2024

## 2024-05-15 NOTE — Plan of Care (Signed)

## 2024-05-15 NOTE — Progress Notes (Addendum)
 Hemodialysis Note:  Received patient in bed to unit. Alert and oriented. Informed consent singed and in chart.  Treatment initiated: 0752 Treatment completed: 1130  Access used: Right Subclavian catheter Access issues: None  Patient tolerated well. Transported back to room, alert without acute distress. Report given to patient's RN.  Total UF removed: 500 ml Medications given: Retacrit  10000 units IV, Midodrine  10 mg Tablet  Post HD weight: 70.8 Kg  [facility]

## 2024-05-15 NOTE — Progress Notes (Signed)
 [facility]  ROUNDING NOTE   Subjective:   Patient seen and evaluated during dialysis   HEMODIALYSIS FLOWSHEET:  Blood Flow Rate (mL/min): 350 mL/min Arterial Pressure (mmHg): -143.22 mmHg Venous Pressure (mmHg): 132.92 mmHg TMP (mmHg): 5.45 mmHg Ultrafiltration Rate (mL/min): 532 mL/min Dialysate Flow Rate (mL/min): 300 ml/min Dialysis Fluid Bolus: Normal Saline Bolus Amount (mL): 100 mL  No complaints to offer today  Objective:  Vital signs in last 24 hours:  Temp:  [97.9 F (36.6 C)-98.3 F (36.8 C)] 98.1 F (36.7 C) (11/20 0734) Pulse Rate:  [54-86] 86 (11/20 1030) Resp:  [11-20] 17 (11/20 1030) BP: (94-123)/(42-77) 97/77 (11/20 1030) SpO2:  [94 %-100 %] 98 % (11/20 1030) Weight:  [71.3 kg-73.6 kg] 71.3 kg (11/20 0734)  Weight change: 2.8 kg Filed Weights   05/14/24 0500 05/15/24 0500 05/15/24 0734  Weight: 70.6 kg 73.6 kg 71.3 kg    Intake/Output: I/O last 3 completed shifts: In: 840 [P.O.:840] Out: -    Intake/Output this shift:  No intake/output data recorded.  Physical Exam: General: NAD  Head: Normocephalic, atraumatic. Moist oral mucosal membranes  Eyes: Anicteric  Lungs:  Clear to auscultation, normal effort  Heart: Regular rate and rhythm  Abdomen:  Soft, nontender  Extremities:  No peripheral edema.  Neurologic: Awake, alert, conversant  Skin: Warm,dry, no rash  Access: Rt internal jugular permcath    Basic Metabolic Panel: Recent Labs  Lab 05/09/24 0651 05/10/24 0148 05/10/24 0434 05/11/24 0404 05/12/24 0405 05/13/24 0500 05/15/24 0500 05/15/24 0752  NA 137 134* 134* 129* 130* 129* 140  --   K 3.7 3.2* 3.3* 5.0 5.2* 5.0 3.6  --   CL 101 102 99 98 99 96* 106  --   CO2 20* 23 24 21* 21* 23 26  --   GLUCOSE 76 68* 106* 104* 99 107* 74  --   BUN 19 7* 7* 10 17 26* 23  --   CREATININE 6.95* 3.36* 3.73* 4.61* 5.37* 6.66* 5.45*  --   CALCIUM 8.8* 7.5* 7.8* 8.2* 8.6* 8.9 7.6*  --   MG 2.3 1.8  --  1.8 2.7* 2.6*  --   --    PHOS 3.8  --   --   --   --   --   --  2.5    Liver Function Tests: Recent Labs  Lab 05/09/24 0651 05/11/24 0404 05/14/24 1027 05/15/24 0752  AST  --  31  --   --   ALT  --  11  --   --   ALKPHOS  --  65  --   --   BILITOT  --  0.3  --   --   PROT  --  5.4*  --   --   ALBUMIN 2.9* 3.3* 3.2* 2.9*   No results for input(s): LIPASE, AMYLASE in the last 168 hours. No results for input(s): AMMONIA in the last 168 hours.  CBC: Recent Labs  Lab 05/09/24 0651 05/11/24 0404 05/12/24 0405 05/13/24 0500 05/15/24 0500  WBC 11.3* 6.9 11.7* 12.0* 10.6*  NEUTROABS 6.9  --   --   --  4.6  HGB 9.3* 8.5* 8.3* 8.0* 7.5*  HCT 29.9* 26.6* 25.6* 24.7* 23.9*  MCV 100.3* 99.3 97.7 97.6 99.6  PLT 291 270 242 256 258    Cardiac Enzymes: No results for input(s): CKTOTAL, CKMB, CKMBINDEX, TROPONINI in the last 168 hours.  BNP: Invalid input(s): POCBNP  CBG: Recent Labs  Lab 05/11/24 2326 05/12/24 0437  05/12/24 0734 05/12/24 1132 05/14/24 2036  GLUCAP 95 106* 88 124* 124*    Microbiology: Results for orders placed or performed during the hospital encounter of 05/06/24  Blood culture (single)     Status: None   Collection Time: 05/06/24 11:05 PM   Specimen: BLOOD  Result Value Ref Range Status   Specimen Description BLOOD BLOOD RIGHT HAND  Final   Special Requests   Final    BOTTLES DRAWN AEROBIC AND ANAEROBIC Blood Culture adequate volume   Culture   Final    NO GROWTH 5 DAYS Performed at [facility], [address] [place], [place] [address]    Report Status 05/11/2024 FINAL  Final  MRSA Next Gen by PCR, Nasal     Status: None   Collection Time: 05/07/24  9:37 AM   Specimen: Nasal Mucosa; Nasal Swab  Result Value Ref Range Status   MRSA by PCR Next Gen NOT DETECTED NOT DETECTED Final    Comment: (NOTE) The GeneXpert MRSA Assay (FDA approved for NASAL specimens only), is one component of a comprehensive MRSA colonization  surveillance program. It is not intended to diagnose MRSA infection nor to guide or monitor treatment for MRSA infections. Test performance is not FDA approved in patients less than 76 years old. Performed at [facility], [address] [place], [place] [address]   Culture, blood (single) w Reflex to ID Panel     Status: None   Collection Time: 05/07/24 11:49 AM   Specimen: BLOOD  Result Value Ref Range Status   Specimen Description BLOOD BLOOD LEFT HAND  Final   Special Requests   Final    BOTTLES DRAWN AEROBIC AND ANAEROBIC Blood Culture adequate volume   Culture   Final    NO GROWTH 5 DAYS Performed at [facility], [address] [place], [place] [address]    Report Status 05/12/2024 FINAL  Final  Resp panel by RT-PCR (RSV, Flu A&B, Covid) Anterior Nasal Swab     Status: None   Collection Time: 05/07/24  3:12 PM   Specimen: Anterior Nasal Swab  Result Value Ref Range Status   SARS Coronavirus 2 by RT PCR NEGATIVE NEGATIVE Final    Comment: (NOTE) SARS-CoV-2 target nucleic acids are NOT DETECTED.  The SARS-CoV-2 RNA is generally detectable in upper respiratory specimens during the acute phase of infection. The lowest concentration of SARS-CoV-2 viral copies this assay can detect is 138 copies/mL. A negative result does not preclude SARS-Cov-2 infection and should not be used as the sole basis for treatment or other patient management decisions. A negative result may occur with  improper specimen collection/handling, submission of specimen other than nasopharyngeal swab, presence of viral mutation(s) within the areas targeted by this assay, and inadequate number of viral copies(<138 copies/mL). A negative result must be combined with clinical observations, patient history, and epidemiological information. The expected result is Negative.  Fact Sheet for Patients:  bloggercourse.com  Fact Sheet for Healthcare Providers:   seriousbroker.it  This test is no t yet approved or cleared by the [place]  FDA and  has been authorized for detection and/or diagnosis of SARS-CoV-2 by FDA under an Emergency Use Authorization (EUA). This EUA will remain  in effect (meaning this test can be used) for the duration of the COVID-19 declaration under Section 564(b)(1) of the Act, 21 U.S.C.section 360bbb-3(b)(1), unless the authorization is terminated  or revoked sooner.       Influenza A by PCR NEGATIVE NEGATIVE Final  Influenza B by PCR NEGATIVE NEGATIVE Final    Comment: (NOTE) The Xpert Xpress SARS-CoV-2/FLU/RSV plus assay is intended as an aid in the diagnosis of influenza from Nasopharyngeal swab specimens and should not be used as a sole basis for treatment. Nasal washings and aspirates are unacceptable for Xpert Xpress SARS-CoV-2/FLU/RSV testing.  Fact Sheet for Patients: bloggercourse.com  Fact Sheet for Healthcare Providers: seriousbroker.it  This test is not yet approved or cleared by the [place]  FDA and has been authorized for detection and/or diagnosis of SARS-CoV-2 by FDA under an Emergency Use Authorization (EUA). This EUA will remain in effect (meaning this test can be used) for the duration of the COVID-19 declaration under Section 564(b)(1) of the Act, 21 U.S.C. section 360bbb-3(b)(1), unless the authorization is terminated or revoked.     Resp Syncytial Virus by PCR NEGATIVE NEGATIVE Final    Comment: (NOTE) Fact Sheet for Patients: bloggercourse.com  Fact Sheet for Healthcare Providers: seriousbroker.it  This test is not yet approved or cleared by the [place]  FDA and has been authorized for detection and/or diagnosis of SARS-CoV-2 by FDA under an Emergency Use Authorization (EUA). This EUA will remain in effect (meaning this test can be used) for  the duration of the COVID-19 declaration under Section 564(b)(1) of the Act, 21 U.S.C. section 360bbb-3(b)(1), unless the authorization is terminated or revoked.  Performed at [facility], [address] [place], [place] [address]     Coagulation Studies: No results for input(s): LABPROT, INR in the last 72 hours.   Urinalysis: No results for input(s): COLORURINE, LABSPEC, PHURINE, GLUCOSEU, HGBUR, BILIRUBINUR, KETONESUR, PROTEINUR, UROBILINOGEN, NITRITE, LEUKOCYTESUR in the last 72 hours.  Invalid input(s): APPERANCEUR    Imaging: No results found.    Medications:      atorvastatin   20 mg Oral Daily   Chlorhexidine  Gluconate Cloth  6 each Topical Q0600   cinacalcet   60 mg Oral Q breakfast   epoetin  alfa-epbx (RETACRIT ) injection  10,000 Units Intravenous Q T,Th,Sat-1800   feeding supplement (NEPRO CARB STEADY)  237 mL Oral BID BM   fludrocortisone   0.1 mg Oral Daily   fluticasone  furoate-vilanterol  1 puff Inhalation Daily   gabapentin   300 mg Oral QHS   heparin   5,000 Units Subcutaneous Q8H   midodrine   20 mg Oral TID WC   multivitamin  1 tablet Oral QHS   psyllium  1 packet Oral Daily   acetaminophen  **OR** acetaminophen , dextrose , HYDROcodone -acetaminophen , loperamide , ondansetron  **OR** ondansetron  (ZOFRAN ) IV, mouth rinse  Assessment/ Plan:  Cynthia Harvey is a 73 y.o.  female  with past medical history of SLE, pAfib, chronic hypotension and end stage renal disease on hemodialysis, who was admitted to [facility] on 05/06/2024 for Hypoglycemia [E16.2] Hypotension [I95.9] Sepsis (HCC) [A41.9] Hypotension, unspecified hypotension type [I95.9]   End-stage renal disease on hemodialysis. Receiving dialysis today, UF 0.5L as tolerated. Next treatment scheduled for Saturday.    2. Anemia of chronic kidney disease Lab Results  Component Value Date   HGB 7.5 (L) 05/15/2024  Patient receives Mircera at outpatient clinic.   Continue Retacrit  10000 units IV with dialysis.   3. Secondary Hyperparathyroidism: with outpatient labs: PTH 366, phosphorus 4.5, calcium  8.6 on 11//25    Lab Results  Component Value Date   CALCIUM  7.6 (L) 05/15/2024   PHOS 2.5 05/15/2024    Patient prescribed calcitriol, cholecalciferol, Sensipar , and sevelamer outpatient. Continue Sensipar . Will continue calcitriol and cholecalciferol outpatient.   4. Hypotension likely secondary to sepsis. Suspected  bacterial peritonitis . Recent PD catheter removal. Elevated white count and lactic acid. Completed meropenem.  Prescribed midodrine   LOS: 8 Caran Storck 11/20/202510:43 AM

## 2024-05-15 NOTE — TOC Transition Note (Signed)
 Transition of Care [facility]) - Discharge Note   Patient Details  Name: Cynthia Harvey MRN: [id number] Date of Birth: June 13, 1951  [facility] CM/SW Contact:  Shasta DELENA Daring, RN Phone Number: 05/15/2024, 2:45 PM   Clinical Narrative:    Patient has signed discharge order. Accepted placement at [facility]. Transportation at discharge with [facility]. Patient declined offer to have RNCM notify family. No additional TOC needs identified. RNCM signing off.   Final next level of care: Skilled Nursing Facility Barriers to Discharge: Barriers Resolved   Patient Goals and CMS Choice            Discharge Placement   Existing PASRR number confirmed : 05/13/24          Patient chooses bed at: [facility] [place] Patient to be transferred to facility by: [facility] Name of family member notified: patient will notify    Discharge Plan and Services Additional resources added to the After Visit Summary for                                       Social Drivers of Health (SDOH) Interventions SDOH Screenings   Food Insecurity: No Food Insecurity (05/07/2024)  Housing: Unknown (05/07/2024)  Transportation Needs: No Transportation Needs (05/07/2024)  Utilities: Not At Risk (05/07/2024)  Financial Resource Strain: Low Risk (03/14/2024)   Received from [facility]  Physical Activity: Unknown (06/01/2023)   Received from [facility]  Social Connections: Unknown (05/07/2024)  Stress: No Stress Concern Present (06/01/2023)   Received from [facility]  Tobacco Use: Low Risk  (05/06/2024)  Health Literacy: Low Risk (10/01/2020)   Received from [facility]     Readmission Risk Interventions     No data to display

## 2024-05-15 NOTE — Progress Notes (Signed)
 Central line removed from patient's right femoral. Central line was intact and tip was visualized. Pressure held and pressure dressing applied. Instructed patient on post removal care. Bed rest until 1615.

## 2024-05-15 NOTE — TOC Progression Note (Signed)
 Transition of Care [facility]) - Progression Note    Patient Details  Name: Cynthia Harvey MRN: [id number] Date of Birth: [birth year]  [facility]) CM/SW Contact  Shasta DELENA Daring, RN Phone Number: 05/15/2024, 10:28 AM  Clinical Narrative:    10:28 AM Discharge summary and SNF transfer report sent to [facility].      Barriers to Discharge: Continued Medical Work up               Expected Discharge Plan and Services       Living arrangements for the past 2 months: Skilled Nursing Facility Expected Discharge Date: 05/15/24                                     Social Drivers of Health (SDOH) Interventions SDOH Screenings   Food Insecurity: No Food Insecurity (05/07/2024)  Housing: Unknown (05/07/2024)  Transportation Needs: No Transportation Needs (05/07/2024)  Utilities: Not At Risk (05/07/2024)  Financial Resource Strain: Low Risk (03/14/2024)   Received from [facility]  Physical Activity: Unknown (06/01/2023)   Received from [facility]  Social Connections: Unknown (05/07/2024)  Stress: No Stress Concern Present (06/01/2023)   Received from [facility]  Tobacco Use: Low Risk  (05/06/2024)  Health Literacy: Low Risk (10/01/2020)   Received from [facility]    Readmission Risk Interventions     No data to display

## 2024-05-16 NOTE — Progress Notes (Signed)
 D/C order noted. Contacted [facility]  [phone] [phone] to be advised of pt and d/c. The pt should resume care on 05/17/24. Requested documents faxed to clinic for continuation of care.  Suzen Satchel Dialysis Navigator [email or web address]
